# Patient Record
Sex: Female | Born: 1985 | Race: Black or African American | Hispanic: No | Marital: Single
Health system: Southern US, Community
[De-identification: ages and names within clinical notes are randomized; demographics above are authoritative.]

## PROBLEM LIST (undated history)

## (undated) DIAGNOSIS — K219 Gastro-esophageal reflux disease without esophagitis: Secondary | ICD-10-CM

## (undated) DIAGNOSIS — J45909 Unspecified asthma, uncomplicated: Secondary | ICD-10-CM

## (undated) DIAGNOSIS — J302 Other seasonal allergic rhinitis: Secondary | ICD-10-CM

## (undated) DIAGNOSIS — M549 Dorsalgia, unspecified: Secondary | ICD-10-CM

## (undated) HISTORY — DX: Gastro-esophageal reflux disease without esophagitis: K21.9

---

## 2016-04-20 ENCOUNTER — Encounter (HOSPITAL_COMMUNITY): Payer: Self-pay | Admitting: Emergency Medicine

## 2016-04-20 ENCOUNTER — Emergency Department (HOSPITAL_COMMUNITY)
Admission: EM | Admit: 2016-04-20 | Discharge: 2016-04-20 | Disposition: A | Discharge status: Home / Self Care | Payer: Medicare Other | Attending: Emergency Medicine | Admitting: Emergency Medicine

## 2016-04-20 DIAGNOSIS — M545 Low back pain, unspecified: Secondary | ICD-10-CM

## 2016-04-20 DIAGNOSIS — F17210 Nicotine dependence, cigarettes, uncomplicated: Secondary | ICD-10-CM | POA: Insufficient documentation

## 2016-04-20 DIAGNOSIS — Z79899 Other long term (current) drug therapy: Secondary | ICD-10-CM | POA: Diagnosis not present

## 2016-04-20 HISTORY — DX: Dorsalgia, unspecified: M54.9

## 2016-04-20 LAB — URINALYSIS, ROUTINE W REFLEX MICROSCOPIC
Bilirubin Urine: NEGATIVE
Glucose, UA: NEGATIVE mg/dL
Ketones, ur: NEGATIVE mg/dL
Leukocytes, UA: NEGATIVE
Nitrite: NEGATIVE
PROTEIN: NEGATIVE mg/dL
SPECIFIC GRAVITY, URINE: 1.011 (ref 1.005–1.030)
pH: 7 (ref 5.0–8.0)

## 2016-04-20 LAB — POC URINE PREG, ED: Preg Test, Ur: NEGATIVE

## 2016-04-20 MED ORDER — NAPROXEN 500 MG PO TABS: 500.0000 mg | ORAL_TABLET | Freq: Two times a day (BID) | ORAL | 0 refills | Status: DC

## 2016-04-20 MED ORDER — KETOROLAC TROMETHAMINE 60 MG/2ML IM SOLN
60.0000 mg | Freq: Once | INTRAMUSCULAR | Status: AC
  Administered 2016-04-20: 60 mg via INTRAMUSCULAR
  Filled 2016-04-20: qty 2

## 2016-04-20 NOTE — ED Triage Notes (Signed)
Pt complaint of acute chronic lower back pain worsening over past week. Pt verbalizes pain started 9 years ago post epidural with birth of son. Pt denies GU symptoms or recent injury.

## 2016-04-20 NOTE — Discharge Instructions (Signed)
Please take naproxen as needed for pain. Use cold/warm compress to area. Massage, stretch low back. Please follow-up with orthopedic surgery and your primary care provider on Monday regarding today's visit  Get help right away if: You develop new bowel or bladder control problems. You have unusual weakness or numbness in your arms or legs. You develop nausea or vomiting. You develop abdominal pain. You feel faint.

## 2016-04-20 NOTE — ED Provider Notes (Signed)
[facility]-EMERGENCY DEPT Provider Note   CSN: [id number] Arrival date & time: 04/20/16  1741   By signing my name below, I, Soijett Blue, attest that this documentation has been prepared under the direction and in the presence of Lorretta Harp, PA-C Electronically Signed: Soijett Blue, ED Scribe. 04/20/16. 6:41 PM.  History   Chief Complaint Chief Complaint  Patient presents with  . Back Pain    HPI Amanda Gray is a 31 y.o. female with a PMHx of back pain, who presents to the Emergency Department complaining of acute on chronic, constant, sharp/pressure, worsening, bilateral lower back pain onset 1 week ago. She notes that she first experienced her lower back pain s/p epidural 9 years ago. She states that her lower back pain is worsened with position change from seated position. Pt reports associated abdominal pain that is baseline for her. Pt has tried her mother's Rx muscle relaxer and tylenol with no relief of her symptoms. Pt reports that she recently moved to the area 1.5 years ago from [place], [place] and denies having a PCP or being evaluated in the past for her symptoms. Denies fever, chills, nausea, vomiting, bowel/bladder incontinence, dysuria, frequency, urgency, weight loss, night sweats, and any other symptoms. Denies CA or IV drug use, but endorses using cocaine several years ago.   The history is provided by the patient. No language interpreter was used.    Past Medical History:  Diagnosis Date  . Back pain     There are no active problems to display for this patient.   Past Surgical History:  Procedure Laterality Date  . CESAREAN SECTION      OB History    No data available       Home Medications    Prior to Admission medications   Medication Sig Start Date End Date Taking? Authorizing Provider  naproxen (NAPROSYN) 500 MG tablet Take 1 tablet (500 mg total) by mouth 2 (two) times daily. 04/20/16   Francisco Orson Aloe, [place]    Family History No  family history on file.  Social History Social History  Substance Use Topics  . Smoking status: Current Every Day Smoker    Packs/day: 1.00    Types: Cigarettes  . Smokeless tobacco: Never Used  . Alcohol use No     Allergies   Patient has no known allergies.   Review of Systems Review of Systems  Constitutional: Negative for chills, fever and unexpected weight change.  Gastrointestinal: Positive for abdominal pain. Negative for nausea and vomiting.       No bowel incontinence  Genitourinary: Negative for difficulty urinating, dysuria, frequency and urgency.       No bladder incontinence  Musculoskeletal: Positive for back pain (lower). Negative for gait problem and joint swelling.  Neurological: Negative for numbness.       No tingling     Physical Exam Updated Vital Signs BP (!) 149/99 (BP Location: Right Arm)   Pulse 78   Temp 97.5 F (36.4 C) (Oral)   Resp 18   LMP 02/29/2016   SpO2 100%   Physical Exam  Constitutional: She is oriented to person, place, and time. She appears well-developed and well-nourished.  Well appearing  HENT:  Head: Normocephalic and atraumatic.  Nose: Nose normal.  Mouth/Throat: Oropharynx is clear and moist.  Eyes: EOM are normal. Pupils are equal, round, and reactive to light.  Neck: Normal range of motion.  Normal ROM, no neck tenderness. No nuchal rigidity  Cardiovascular: Normal rate, normal  heart sounds and intact distal pulses.   Pulmonary/Chest: Effort normal and breath sounds normal. No respiratory distress.  Normal work of breathing  Abdominal: Soft. There is tenderness. There is no rebound and no guarding.  Soft and minimal tenderness to lower abdomen. No rebound or guarding. No pulsatile mass noted.   Musculoskeletal: She exhibits tenderness. She exhibits no deformity.       Cervical back: Normal.       Thoracic back: Normal.       Lumbar back: She exhibits tenderness and bony tenderness.  There is tenderness to  midline lower back and left and right side of lower back. No midline cervical, thoracic spine tenderness. Good ROM of spine. No deformity. No obvious wound, redness, or swelling noted.   Neurological: She is alert and oriented to person, place, and time.  Cranial Nerves:  III,IV, VI: ptosis not present, extra-ocular movements intact bilaterally, direct and consensual pupillary light reflexes intact bilaterally V: facial sensation, jaw opening, and bite strength equal bilaterally VII: eyebrow raise, eyelid close, smile, frown, pucker equal bilaterally VIII: hearing grossly normal bilaterally  IX,X: palate elevation and swallowing intact XI: bilateral shoulder shrug and lateral head rotation equal and strong XII: midline tongue extension  Negative pronator drift, negative Romberg, negative RAM's, negative heel-to-shin, negative finger to nose.    Sensory intact.  Muscle strength 5/5 Patient able to ambulate without difficulty.   SLR Right -  negative SLR Left -  negative  Skin: Skin is warm.  Psychiatric: She has a normal mood and affect. Her behavior is normal.  Nursing note and vitals reviewed.    ED Treatments / Results  DIAGNOSTIC STUDIES: Oxygen Saturation is 100% on RA, nl by my interpretation.    COORDINATION OF CARE: 6:28 PM Discussed treatment plan with pt at bedside which includes UA and pt agreed to plan.   Labs (all labs ordered are listed, but only abnormal results are displayed) Labs Reviewed  URINALYSIS, ROUTINE W REFLEX MICROSCOPIC - Abnormal; Notable for the following:       Result Value   Hgb urine dipstick MODERATE (*)    Bacteria, UA RARE (*)    Squamous Epithelial / LPF 0-5 (*)    All other components within normal limits  POC URINE PREG, ED    EKG  EKG Interpretation None       Radiology No results found.  Procedures Procedures (including critical care time)  Medications Ordered in ED Medications  ketorolac (TORADOL) injection 60 mg (not  administered)     Initial Impression / Assessment and Plan / ED Course  I have reviewed the triage vital signs and the nursing notes.  Pertinent labs & imaging results that were available during my care of the patient were reviewed by me and considered in my medical decision making (see chart for details).     Patient with back pain.  No neurological deficits and normal neuro exam.  Patient is ambulatory.  No loss of bowel or bladder control.  No concern for cauda equina.  No fever, night sweats, weight loss, h/o cancer, IVDA, no recent procedure to back. No urinary symptoms suggestive of UTI. UA negative evidence for infection. Pt given shot of toradol here in ED. Supportive care and return precaution discussed. Appears safe for discharge at this time. Follow up as indicated in discharge paperwork.    Final Clinical Impressions(s) / ED Diagnoses   Final diagnoses:  Acute midline low back pain without sciatica    New  Prescriptions New Prescriptions   NAPROXEN (NAPROSYN) 500 MG TABLET    Take 1 tablet (500 mg total) by mouth 2 (two) times daily.   I personally performed the services described in this documentation, which was scribed in my presence. The recorded information has been reviewed and is accurate.    [address] [place], [place] 04/20/16 2030    Nira Conn, MD 04/21/16 [phone]

## 2016-10-18 ENCOUNTER — Other Ambulatory Visit: Payer: Self-pay | Admitting: Internal Medicine

## 2016-10-18 DIAGNOSIS — R1011 Right upper quadrant pain: Secondary | ICD-10-CM

## 2016-10-22 ENCOUNTER — Other Ambulatory Visit: Payer: Medicare Other

## 2016-10-28 ENCOUNTER — Other Ambulatory Visit: Payer: Medicare Other

## 2017-03-10 ENCOUNTER — Other Ambulatory Visit: Payer: Self-pay | Admitting: Internal Medicine

## 2017-03-10 DIAGNOSIS — R1013 Epigastric pain: Secondary | ICD-10-CM

## 2017-11-22 ENCOUNTER — Other Ambulatory Visit: Payer: Self-pay | Admitting: Internal Medicine

## 2017-11-22 DIAGNOSIS — R1013 Epigastric pain: Secondary | ICD-10-CM

## 2017-11-27 ENCOUNTER — Encounter: Payer: Self-pay | Admitting: *Deleted

## 2017-12-11 ENCOUNTER — Encounter: Payer: Self-pay | Admitting: *Deleted

## 2017-12-16 ENCOUNTER — Encounter: Payer: Medicaid Other | Admitting: Advanced Practice Midwife

## 2018-03-05 ENCOUNTER — Emergency Department (HOSPITAL_COMMUNITY)
Admission: EM | Admit: 2018-03-05 | Discharge: 2018-03-06 | Disposition: A | Discharge status: Home / Self Care | Payer: Medicaid Other | Attending: Emergency Medicine | Admitting: Emergency Medicine

## 2018-03-05 ENCOUNTER — Encounter (HOSPITAL_COMMUNITY): Payer: Self-pay | Admitting: Emergency Medicine

## 2018-03-05 ENCOUNTER — Other Ambulatory Visit: Payer: Self-pay

## 2018-03-05 DIAGNOSIS — R109 Unspecified abdominal pain: Secondary | ICD-10-CM | POA: Diagnosis not present

## 2018-03-05 DIAGNOSIS — R55 Syncope and collapse: Secondary | ICD-10-CM | POA: Insufficient documentation

## 2018-03-05 DIAGNOSIS — F17210 Nicotine dependence, cigarettes, uncomplicated: Secondary | ICD-10-CM | POA: Diagnosis not present

## 2018-03-05 DIAGNOSIS — Z79899 Other long term (current) drug therapy: Secondary | ICD-10-CM | POA: Insufficient documentation

## 2018-03-05 DIAGNOSIS — R079 Chest pain, unspecified: Secondary | ICD-10-CM | POA: Diagnosis not present

## 2018-03-05 LAB — SAMPLE TO BLOOD BANK

## 2018-03-05 MED ORDER — FENTANYL CITRATE (PF) 100 MCG/2ML IJ SOLN
50.0000 ug | Freq: Once | INTRAMUSCULAR | Status: AC
  Administered 2018-03-05: 50 ug via INTRAVENOUS
  Filled 2018-03-05: qty 2
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
## 2018-03-06 ENCOUNTER — Emergency Department (HOSPITAL_COMMUNITY): Payer: Medicaid Other

## 2018-03-06 LAB — CBC
HCT: 45.8 % (ref 36.0–46.0)
Hemoglobin: 14.7 g/dL (ref 12.0–15.0)
MCH: 30.2 pg (ref 26.0–34.0)
MCHC: 32.1 g/dL (ref 30.0–36.0)
MCV: 94 fL (ref 80.0–100.0)
NRBC: 0 % (ref 0.0–0.2)
PLATELETS: 220 10*3/uL (ref 150–400)
RBC: 4.87 MIL/uL (ref 3.87–5.11)
RDW: 13.4 % (ref 11.5–15.5)
WBC: 11.9 10*3/uL — AB (ref 4.0–10.5)

## 2018-03-06 LAB — COMPREHENSIVE METABOLIC PANEL
ALT: 21 U/L (ref 0–44)
ANION GAP: 6 (ref 5–15)
AST: 24 U/L (ref 15–41)
Albumin: 3.3 g/dL — ABNORMAL LOW (ref 3.5–5.0)
Alkaline Phosphatase: 76 U/L (ref 38–126)
BUN: 12 mg/dL (ref 6–20)
CHLORIDE: 110 mmol/L (ref 98–111)
CO2: 22 mmol/L (ref 22–32)
Calcium: 8.6 mg/dL — ABNORMAL LOW (ref 8.9–10.3)
Creatinine, Ser: 1.39 mg/dL — ABNORMAL HIGH (ref 0.44–1.00)
GFR calc Af Amer: 58 mL/min — ABNORMAL LOW (ref >60)
GFR, EST NON AFRICAN AMERICAN: 50 mL/min — AB (ref >60)
Glucose, Bld: 102 mg/dL — ABNORMAL HIGH (ref 70–99)
Potassium: 3.5 mmol/L (ref 3.5–5.1)
Sodium: 138 mmol/L (ref 135–145)
Total Bilirubin: 0.6 mg/dL (ref 0.3–1.2)
Total Protein: 6.6 g/dL (ref 6.5–8.1)

## 2018-03-06 LAB — ETHANOL

## 2018-03-06 LAB — LACTIC ACID, PLASMA: Lactic Acid, Venous: 1.2 mmol/L (ref 0.5–1.9)

## 2018-03-06 LAB — I-STAT BETA HCG BLOOD, ED (MC, WL, AP ONLY)

## 2018-03-06 LAB — PROTIME-INR
INR: 1.02
Prothrombin Time: 13.3 seconds (ref 11.4–15.2)

## 2018-03-06 MED ORDER — IOHEXOL 300 MG/ML  SOLN
100.0000 mL | Freq: Once | INTRAMUSCULAR | Status: AC | PRN
  Administered 2018-03-06: 100 mL via INTRAVENOUS

## 2018-03-06 MED ORDER — CYCLOBENZAPRINE HCL 10 MG PO TABS: 10.0000 mg | ORAL_TABLET | Freq: Two times a day (BID) | ORAL | 0 refills | Status: DC | PRN

## 2018-03-06 MED ORDER — HYDROCODONE-ACETAMINOPHEN 5-325 MG PO TABS
2.0000 | ORAL_TABLET | Freq: Once | ORAL | Status: AC
  Administered 2018-03-06: 2 via ORAL
  Filled 2018-03-06: qty 2

## 2018-03-06 MED ORDER — IBUPROFEN 800 MG PO TABS: 800.0000 mg | ORAL_TABLET | Freq: Three times a day (TID) | ORAL | 0 refills | Status: DC

## 2018-03-06 NOTE — ED Notes (Signed)
Patient Alert and oriented to baseline. Stable and ambulatory to baseline. Patient verbalized understanding of the discharge instructions.  Patient belongings were taken by the patient.   

## 2018-03-06 NOTE — ED Notes (Signed)
Patient transported to CT 

## 2018-10-15 ENCOUNTER — Other Ambulatory Visit: Payer: Self-pay | Admitting: Physician Assistant

## 2018-10-15 ENCOUNTER — Other Ambulatory Visit: Payer: Self-pay

## 2018-10-15 ENCOUNTER — Encounter (HOSPITAL_COMMUNITY): Payer: Self-pay | Admitting: Emergency Medicine

## 2018-10-15 ENCOUNTER — Emergency Department (HOSPITAL_COMMUNITY)
Admission: EM | Admit: 2018-10-15 | Discharge: 2018-10-15 | Disposition: A | Discharge status: Home / Self Care | Payer: Medicaid Other | Attending: Emergency Medicine | Admitting: Emergency Medicine

## 2018-10-15 DIAGNOSIS — F17210 Nicotine dependence, cigarettes, uncomplicated: Secondary | ICD-10-CM | POA: Insufficient documentation

## 2018-10-15 DIAGNOSIS — R300 Dysuria: Secondary | ICD-10-CM | POA: Diagnosis present

## 2018-10-15 DIAGNOSIS — N390 Urinary tract infection, site not specified: Secondary | ICD-10-CM | POA: Diagnosis not present

## 2018-10-15 DIAGNOSIS — R10815 Periumbilic abdominal tenderness: Secondary | ICD-10-CM | POA: Insufficient documentation

## 2018-10-15 LAB — LIPASE, BLOOD: Lipase: 27 U/L (ref 11–51)

## 2018-10-15 LAB — URINALYSIS, ROUTINE W REFLEX MICROSCOPIC
Bilirubin Urine: NEGATIVE
Glucose, UA: NEGATIVE mg/dL
Ketones, ur: 5 mg/dL — AB
Nitrite: NEGATIVE
Protein, ur: NEGATIVE mg/dL
Specific Gravity, Urine: 1.018 (ref 1.005–1.030)
pH: 5 (ref 5.0–8.0)

## 2018-10-15 LAB — CBC
HCT: 47 % — ABNORMAL HIGH (ref 36.0–46.0)
Hemoglobin: 16.1 g/dL — ABNORMAL HIGH (ref 12.0–15.0)
MCH: 32.5 pg (ref 26.0–34.0)
MCHC: 34.3 g/dL (ref 30.0–36.0)
MCV: 94.9 fL (ref 80.0–100.0)
Platelets: 271 10*3/uL (ref 150–400)
RBC: 4.95 MIL/uL (ref 3.87–5.11)
RDW: 12.8 % (ref 11.5–15.5)
WBC: 7.5 10*3/uL (ref 4.0–10.5)
nRBC: 0 % (ref 0.0–0.2)

## 2018-10-15 LAB — COMPREHENSIVE METABOLIC PANEL
ALT: 20 U/L (ref 0–44)
AST: 19 U/L (ref 15–41)
Albumin: 3.8 g/dL (ref 3.5–5.0)
Alkaline Phosphatase: 87 U/L (ref 38–126)
Anion gap: 9 (ref 5–15)
BUN: 7 mg/dL (ref 6–20)
CO2: 25 mmol/L (ref 22–32)
Calcium: 8.9 mg/dL (ref 8.9–10.3)
Chloride: 106 mmol/L (ref 98–111)
Creatinine, Ser: 1.11 mg/dL — ABNORMAL HIGH (ref 0.44–1.00)
GFR calc Af Amer: >60 mL/min (ref >60)
GFR calc non Af Amer: >60 mL/min (ref >60)
Glucose, Bld: 92 mg/dL (ref 70–99)
Potassium: 3.9 mmol/L (ref 3.5–5.1)
Sodium: 140 mmol/L (ref 135–145)
Total Bilirubin: 0.8 mg/dL (ref 0.3–1.2)
Total Protein: 7.8 g/dL (ref 6.5–8.1)

## 2018-10-15 LAB — I-STAT BETA HCG BLOOD, ED (MC, WL, AP ONLY): I-stat hCG, quantitative: <5 m[IU]/mL (ref <5)

## 2018-10-15 LAB — WET PREP, GENITAL
Clue Cells Wet Prep HPF POC: NONE SEEN
Sperm: NONE SEEN
Trich, Wet Prep: NONE SEEN
Yeast Wet Prep HPF POC: NONE SEEN

## 2018-10-15 MED ORDER — SODIUM CHLORIDE 0.9% FLUSH: 3.0000 mL | Freq: Once | INTRAVENOUS | Status: DC

## 2018-10-15 MED ORDER — CEPHALEXIN 500 MG PO CAPS: 500.0000 mg | ORAL_CAPSULE | Freq: Four times a day (QID) | ORAL | 0 refills | Status: DC

## 2018-10-15 MED ORDER — PHENAZOPYRIDINE HCL 200 MG PO TABS: 200.0000 mg | ORAL_TABLET | Freq: Three times a day (TID) | ORAL | 0 refills | Status: DC

## 2018-10-15 MED ORDER — FAMOTIDINE 20 MG PO TABS: 20.0000 mg | ORAL_TABLET | Freq: Two times a day (BID) | ORAL | 0 refills | Status: DC

## 2018-10-15 NOTE — Discharge Instructions (Addendum)
Please read instructions below. Take your antibiotic as directed until it is gone. Drink plenty of water. Schedule an appointment with your primary care provider to follow up in 1 week. Return to the ER if you develop a fever, have nausea, worsening back pain, or new or concerning symptoms.

## 2018-10-15 NOTE — ED Triage Notes (Signed)
Pt complains of lower abd pain for several days along with dysuria.

## 2018-10-15 NOTE — ED Notes (Signed)
Pelvic cart at door. Pt changed into gown.

## 2018-10-15 NOTE — ED Provider Notes (Signed)
[facility] Provider Note   CSN: [id number] Arrival date & time: 10/15/18  1152     History   Chief Complaint Chief Complaint  Patient presents with  . Abdominal Pain  . Dysuria    HPI Amanda Gray is a 33 y.o. female with no relevant past medical history presents to the ER with a 4-day history of dysuria, suprapubic discomfort, and flank pain bilaterally.  She states that the pain is a 2 out of 10, but that her flank pain makes it difficult for her to get comfortable enough to sleep. Flank pain does not only occur with urination. She has not taken any for her discomfort.  Denies any aggravating or alleviating factors.  She also would like to get tested for STIs.  Denies any fevers, chills, abnormal discharge, change in bowel habits, nausea, diminished appetite, vomiting, shortness of breath, chest pain, headache, or dizziness.     HPI  Past Medical History:  Diagnosis Date  . Back pain     There are no active problems to display for this patient.   Past Surgical History:  Procedure Laterality Date  . CESAREAN SECTION       OB History   No obstetric history on file.      Home Medications    Prior to Admission medications   Medication Sig Start Date End Date Taking? Authorizing Provider  albuterol (PROAIR HFA) 108 (90 Base) MCG/ACT inhaler Inhale 2 puffs into the lungs every 4 (four) hours as needed for wheezing.    Yes [provider]  famotidine (PEPCID) 20 MG tablet Take 20 mg by mouth 2 (two) times daily.   Yes [provider]  fluticasone (FLOVENT HFA) 110 MCG/ACT inhaler Inhale 1 puff into the lungs 2 (two) times daily.   Yes [provider]  cephALEXin (KEFLEX) 500 MG capsule Take 1 capsule (500 mg total) by mouth 4 (four) times daily. 10/15/18   Lorelee NewGreen, Melodye Swor L, PA-C  phenazopyridine (PYRIDIUM) 200 MG tablet Take 1 tablet (200 mg total) by mouth 3 (three) times daily. 10/15/18   Lorelee NewGreen, Javien Tesch L,  PA-C    Family History History reviewed. No pertinent family history.  Social History Social History   Tobacco Use  . Smoking status: Current Every Day Smoker    Packs/day: 1.00    Types: Cigarettes  . Smokeless tobacco: Never Used  Substance Use Topics  . Alcohol use: No  . Drug use: No     Allergies   Patient has no known allergies.   Review of Systems Review of Systems  All other systems reviewed and are negative.    Physical Exam Updated Vital Signs BP (!) 140/96   Pulse 100   Temp 98.2 F (36.8 C) (Oral)   Resp 16   Ht 5\' 2"  (1.575 m)   Wt 106.1 kg   LMP 08/29/2018   SpO2 100%   BMI 42.80 kg/m   Physical Exam Constitutional:      Appearance: Normal appearance.  HENT:     Head: Normocephalic and atraumatic.  Eyes:     General: No scleral icterus.    Conjunctiva/sclera: Conjunctivae normal.  Cardiovascular:     Rate and Rhythm: Normal rate and regular rhythm.     Pulses: Normal pulses.     Heart sounds: Normal heart sounds.  Pulmonary:     Effort: Pulmonary effort is normal. No respiratory distress.     Breath sounds: Normal breath sounds.  Abdominal:  Comments: Abdomen is soft, nondistended, tenderness to palpation in lower quadrants, particularly suprapubic region.  No guarding.  No overlying erythema.  Positive CVAT bilaterally.  Genitourinary:    Comments: Speculum exam revealed nonerythematous cervix with no noted discharge. Bimanual exam revealed negative chandelier sign.  No left or right adnexal tenderness.   Neurological:     Mental Status: She is alert.     GCS: GCS eye subscore is 4. GCS verbal subscore is 5. GCS motor subscore is 6.  Psychiatric:        Mood and Affect: Mood normal.        Behavior: Behavior normal.        Thought Content: Thought content normal.      ED Treatments / Results  Labs (all labs ordered are listed, but only abnormal results are displayed) Labs Reviewed  WET PREP, GENITAL - Abnormal; Notable  for the following components:      Result Value   WBC, Wet Prep HPF POC FEW (*)    All other components within normal limits  COMPREHENSIVE METABOLIC PANEL - Abnormal; Notable for the following components:   Creatinine, Ser 1.11 (*)    All other components within normal limits  CBC - Abnormal; Notable for the following components:   Hemoglobin 16.1 (*)    HCT 47.0 (*)    All other components within normal limits  URINALYSIS, ROUTINE W REFLEX MICROSCOPIC - Abnormal; Notable for the following components:   Hgb urine dipstick MODERATE (*)    Ketones, ur 5 (*)    Leukocytes,Ua LARGE (*)    Bacteria, UA RARE (*)    All other components within normal limits  LIPASE, BLOOD  HIV ANTIBODY (ROUTINE TESTING W REFLEX)  RPR  I-STAT BETA HCG BLOOD, ED ([facility], [facility], [facility] ONLY)  GC/CHLAMYDIA PROBE AMP ([place]) NOT AT [facility]  WET PREP  (BD AFFIRM) ([place])    EKG None  Radiology No results found.  Procedures Procedures (including critical care time)  Medications Ordered in ED Medications - No data to display   Initial Impression / Assessment and Plan / ED Course  I have reviewed the triage vital signs and the nursing notes.  Pertinent labs & imaging results that were available during my care of the patient were reviewed by me and considered in my medical decision making (see chart for details).        Patient's history, physical exam, and labs are consistent with a simple cystitis.  While patient reports bilateral flank pain and mild CVAT on exam, patient remains afebrile and white count is very reassuring.  Given dysuria, patient requested screening for STIs.  She denies any abnormal discharge and denies numerous new sexual contacts.  Wet prep obtained was reassuring did not show any evidence of yeast infection, trichomoniasis, or bacterial vaginosis which was consistent with my physical exam.  Patient's UA was interpreted and demonstrates bacteria and large leukocyte count,  concerning for UTI.  Patient also has moderate hemoglobin, but denies flank pain radiating to groin with urination or any other symptoms that would be more suspicious for stones. No nausea or vomiting. Do not believe that stone study is indicated at this point.  Will treat with Keflex outpatient, encourage increased fluid hydration, and will prescribe phenazopyridine for her dysuria.  Discussed return precautions.  Patient voiced understanding and is agreeable to plan.  Final Clinical Impressions(s) / ED Diagnoses   Final diagnoses:  Lower urinary tract infectious disease    ED Discharge  Orders         Ordered    cephALEXin (KEFLEX) 500 MG capsule  4 times daily     10/15/18 1428    phenazopyridine (PYRIDIUM) 200 MG tablet  3 times daily     10/15/18 1428           Elvera Maria 10/15/18 1441    Alvira Monday, MD 10/15/18 2147

## 2018-10-16 LAB — RPR: RPR Ser Ql: NONREACTIVE

## 2018-10-16 LAB — HIV ANTIBODY (ROUTINE TESTING W REFLEX): HIV Screen 4th Generation wRfx: NONREACTIVE

## 2018-10-16 LAB — CERVICOVAGINAL ANCILLARY ONLY
Chlamydia: NEGATIVE
Neisseria Gonorrhea: NEGATIVE

## 2019-01-15 ENCOUNTER — Ambulatory Visit: Payer: Medicaid Other | Admitting: Family Medicine

## 2019-03-28 ENCOUNTER — Other Ambulatory Visit: Payer: Self-pay

## 2019-03-28 ENCOUNTER — Encounter (HOSPITAL_COMMUNITY): Payer: Self-pay | Admitting: Emergency Medicine

## 2019-03-28 ENCOUNTER — Emergency Department (HOSPITAL_COMMUNITY)
Admission: EM | Admit: 2019-03-28 | Discharge: 2019-03-28 | Disposition: A | Discharge status: Home / Self Care | Payer: Medicaid Other | Attending: Emergency Medicine | Admitting: Emergency Medicine

## 2019-03-28 ENCOUNTER — Emergency Department (HOSPITAL_COMMUNITY): Payer: Medicaid Other

## 2019-03-28 DIAGNOSIS — F17210 Nicotine dependence, cigarettes, uncomplicated: Secondary | ICD-10-CM | POA: Insufficient documentation

## 2019-03-28 DIAGNOSIS — R1030 Lower abdominal pain, unspecified: Secondary | ICD-10-CM | POA: Diagnosis not present

## 2019-03-28 DIAGNOSIS — M545 Low back pain, unspecified: Secondary | ICD-10-CM

## 2019-03-28 DIAGNOSIS — Z79899 Other long term (current) drug therapy: Secondary | ICD-10-CM | POA: Insufficient documentation

## 2019-03-28 DIAGNOSIS — Z202 Contact with and (suspected) exposure to infections with a predominantly sexual mode of transmission: Secondary | ICD-10-CM | POA: Insufficient documentation

## 2019-03-28 DIAGNOSIS — N390 Urinary tract infection, site not specified: Secondary | ICD-10-CM | POA: Insufficient documentation

## 2019-03-28 LAB — CBC WITH DIFFERENTIAL/PLATELET
Abs Immature Granulocytes: 0.01 10*3/uL (ref 0.00–0.07)
Basophils Absolute: 0 10*3/uL (ref 0.0–0.1)
Basophils Relative: 0 %
Eosinophils Absolute: 0.2 10*3/uL (ref 0.0–0.5)
Eosinophils Relative: 4 %
HCT: 44.8 % (ref 36.0–46.0)
Hemoglobin: 14.7 g/dL (ref 12.0–15.0)
Immature Granulocytes: 0 %
Lymphocytes Relative: 44 %
Lymphs Abs: 2.4 10*3/uL (ref 0.7–4.0)
MCH: 30.3 pg (ref 26.0–34.0)
MCHC: 32.8 g/dL (ref 30.0–36.0)
MCV: 92.4 fL (ref 80.0–100.0)
Monocytes Absolute: 0.5 10*3/uL (ref 0.1–1.0)
Monocytes Relative: 9 %
Neutro Abs: 2.4 10*3/uL (ref 1.7–7.7)
Neutrophils Relative %: 43 %
Platelets: 232 10*3/uL (ref 150–400)
RBC: 4.85 MIL/uL (ref 3.87–5.11)
RDW: 14.3 % (ref 11.5–15.5)
WBC: 5.6 10*3/uL (ref 4.0–10.5)
nRBC: 0 % (ref 0.0–0.2)

## 2019-03-28 LAB — COMPREHENSIVE METABOLIC PANEL
ALT: 20 U/L (ref 0–44)
AST: 23 U/L (ref 15–41)
Albumin: 3.3 g/dL — ABNORMAL LOW (ref 3.5–5.0)
Alkaline Phosphatase: 83 U/L (ref 38–126)
Anion gap: 9 (ref 5–15)
BUN: 9 mg/dL (ref 6–20)
CO2: 21 mmol/L — ABNORMAL LOW (ref 22–32)
Calcium: 8.5 mg/dL — ABNORMAL LOW (ref 8.9–10.3)
Chloride: 107 mmol/L (ref 98–111)
Creatinine, Ser: 1.05 mg/dL — ABNORMAL HIGH (ref 0.44–1.00)
GFR calc Af Amer: >60 mL/min (ref >60)
GFR calc non Af Amer: >60 mL/min (ref >60)
Glucose, Bld: 91 mg/dL (ref 70–99)
Potassium: 4 mmol/L (ref 3.5–5.1)
Sodium: 137 mmol/L (ref 135–145)
Total Bilirubin: 1 mg/dL (ref 0.3–1.2)
Total Protein: 6.8 g/dL (ref 6.5–8.1)

## 2019-03-28 LAB — WET PREP, GENITAL
Sperm: NONE SEEN
Trich, Wet Prep: NONE SEEN
Yeast Wet Prep HPF POC: NONE SEEN

## 2019-03-28 LAB — URINALYSIS, ROUTINE W REFLEX MICROSCOPIC
Bilirubin Urine: NEGATIVE
Glucose, UA: NEGATIVE mg/dL
Ketones, ur: NEGATIVE mg/dL
Leukocytes,Ua: NEGATIVE
Nitrite: NEGATIVE
Protein, ur: NEGATIVE mg/dL
Specific Gravity, Urine: 1.02 (ref 1.005–1.030)
pH: 6 (ref 5.0–8.0)

## 2019-03-28 LAB — PREGNANCY, URINE: Preg Test, Ur: NEGATIVE

## 2019-03-28 LAB — HIV ANTIBODY (ROUTINE TESTING W REFLEX): HIV Screen 4th Generation wRfx: NONREACTIVE

## 2019-03-28 MED ORDER — CEPHALEXIN 500 MG PO CAPS: 500.0000 mg | ORAL_CAPSULE | Freq: Four times a day (QID) | ORAL | 0 refills | Status: AC

## 2019-03-28 MED ORDER — CEPHALEXIN 250 MG PO CAPS
500.0000 mg | ORAL_CAPSULE | Freq: Once | ORAL | Status: AC
  Administered 2019-03-28: 500 mg via ORAL
  Filled 2019-03-28: qty 2

## 2019-03-28 NOTE — ED Triage Notes (Signed)
Pt reports urinary frequency, burning with urination and lower back pain x1 week. Denies fevers or chills, denies recent sick contacts. Ambulatory to treatment room with steady gait.

## 2019-03-28 NOTE — ED Provider Notes (Signed)
[place] [facility] EMERGENCY DEPARTMENT Provider Note   CSN: [id number] Arrival date & time: 03/28/19  1002     History Chief Complaint  Patient presents with  . Urinary Frequency  . Back Pain    Amanda Gray is a 34 y.o. female who presents for evaluation of 1 week of lower back pain, lower abdominal pain, dysuria, increased urinary frequency that has been ongoing.  She states that she feels like "both of her kidneys hurt and are sore."  He states it is both sides of her lower back.  She states that this pain is worsened when she moves, when she breathes.  She is not having any trouble breathing he denies any chest pain or chest pain with deep inspiration.  She reports that the abdominal pain is mostly in the suprapubic region.  No associated nausea or vomiting.  She states she has not any vaginal discharge but has had some vaginal itching.  She is currently sexually active with 2 partners.  They do not use protection.  She does not have any fevers, chest pain, difficulty breathing, hematuria. She denies any OCP use, recent immobilization, prior history of DVT/PE, recent surgery, leg swelling, or long travel.  The history is provided by the patient.       Past Medical History:  Diagnosis Date  . Back pain     There are no problems to display for this patient.   Past Surgical History:  Procedure Laterality Date  . CESAREAN SECTION       OB History   No obstetric history on file.     No family history on file.  Social History   Tobacco Use  . Smoking status: Current Every Day Smoker    Packs/day: 1.00    Types: Cigarettes  . Smokeless tobacco: Never Used  Substance Use Topics  . Alcohol use: No  . Drug use: No    Home Medications Prior to Admission medications   Medication Sig Start Date End Date Taking? Authorizing Provider  albuterol (PROAIR HFA) 108 (90 Base) MCG/ACT inhaler Inhale 2 puffs into the lungs every 4 (four) hours as needed for  wheezing.     [provider]  cephALEXin (KEFLEX) 500 MG capsule Take 1 capsule (500 mg total) by mouth 4 (four) times daily for 7 days. 03/28/19 04/04/19  Volanda Napoleon, PA-C  famotidine (PEPCID) 20 MG tablet Take 1 tablet (20 mg total) by mouth 2 (two) times daily. 10/15/18   Domenic Moras, PA-C  fluticasone (FLOVENT HFA) 110 MCG/ACT inhaler Inhale 1 puff into the lungs 2 (two) times daily.    [provider]  phenazopyridine (PYRIDIUM) 200 MG tablet Take 1 tablet (200 mg total) by mouth 3 (three) times daily. 10/15/18   Corena Herter, PA-C    Allergies    Patient has no known allergies.  Review of Systems   Review of Systems  Constitutional: Negative for fever.  Respiratory: Negative for cough and shortness of breath.   Cardiovascular: Negative for chest pain.  Gastrointestinal: Positive for abdominal pain. Negative for nausea and vomiting.  Genitourinary: Positive for dysuria, flank pain and hematuria.       Vaginal itching  Musculoskeletal: Positive for back pain.  Neurological: Negative for headaches.  All other systems reviewed and are negative.   Physical Exam Updated Vital Signs BP 135/75 (BP Location: Right Arm)   Pulse 76   Temp 98.5 F (36.9 C) (Oral)   Resp 16   SpO2 100%  Physical Exam Vitals and nursing note reviewed. Exam conducted with a chaperone present.  Constitutional:      Appearance: Normal appearance. She is well-developed.     Comments: Sitting comfortably on examination table  HENT:     Head: Normocephalic and atraumatic.  Eyes:     General: Lids are normal.     Conjunctiva/sclera: Conjunctivae normal.     Pupils: Pupils are equal, round, and reactive to light.  Cardiovascular:     Rate and Rhythm: Normal rate and regular rhythm.     Pulses: Normal pulses.     Heart sounds: Normal heart sounds. No murmur. No friction rub. No gallop.   Pulmonary:     Effort: Pulmonary effort is normal.     Breath sounds: Normal breath  sounds.     Comments: Lungs clear to auscultation bilaterally.  Symmetric chest rise.  No wheezing, rales, rhonchi. Abdominal:     Palpations: Abdomen is soft. Abdomen is not rigid.     Tenderness: There is abdominal tenderness in the suprapubic area. There is right CVA tenderness and left CVA tenderness. There is no guarding.     Comments: Abdomen is soft, non-distended. Mild tenderness noted to the suprapubic region. CVA tenderness noted bilaterally, right > left.   Genitourinary:    Comments: The exam was performed with a chaperone present. Normal external female genitalia.  She has a small 1 cm area of erythema noted to the right labia consistent with an ingrown hair.  It is nontender.  No overlying warmth.  No active drainage.  No CMT.  No cervical discharge.  No adnexal mass or tenderness noted bilaterally. Musculoskeletal:        General: Normal range of motion.     Cervical back: Full passive range of motion without pain.  Skin:    General: Skin is warm and dry.     Capillary Refill: Capillary refill takes less than 2 seconds.  Neurological:     Mental Status: She is alert and oriented to person, place, and time.  Psychiatric:        Speech: Speech normal.     ED Results / Procedures / Treatments   Labs (all labs ordered are listed, but only abnormal results are displayed) Labs Reviewed  WET PREP, GENITAL - Abnormal; Notable for the following components:      Result Value   Clue Cells Wet Prep HPF POC PRESENT (*)    WBC, Wet Prep HPF POC FEW (*)    All other components within normal limits  URINALYSIS, ROUTINE W REFLEX MICROSCOPIC - Abnormal; Notable for the following components:   APPearance HAZY (*)    Hgb urine dipstick MODERATE (*)    Bacteria, UA RARE (*)    All other components within normal limits  COMPREHENSIVE METABOLIC PANEL - Abnormal; Notable for the following components:   CO2 21 (*)    Creatinine, Ser 1.05 (*)    Calcium 8.5 (*)    Albumin 3.3 (*)    All  other components within normal limits  PREGNANCY, URINE  CBC WITH DIFFERENTIAL/PLATELET  HIV ANTIBODY (ROUTINE TESTING W REFLEX)  RPR  WET PREP  (BD AFFIRM) ([place])  GC/CHLAMYDIA PROBE AMP ([place]) NOT AT [facility]    EKG None  Radiology CT Renal Stone Study  Result Date: 03/28/2019 CLINICAL DATA:  34 year old female with acute flank and back pain for 1 week. EXAM: CT ABDOMEN AND PELVIS WITHOUT CONTRAST TECHNIQUE: Multidetector CT imaging of the abdomen and pelvis  was performed following the standard protocol without IV contrast. COMPARISON:  03/06/2018 CT FINDINGS: Please note that parenchymal abnormalities may be missed without intravenous contrast. Lower chest: No acute abnormality. Hepatobiliary: The liver and gallbladder are unremarkable. No biliary dilatation. Pancreas: Unremarkable Spleen: Unremarkable Adrenals/Urinary Tract: The kidneys, adrenal glands and bladder are unremarkable. Stomach/Bowel: Stomach is within normal limits. Appendix appears normal. No evidence of bowel wall thickening, distention, or inflammatory changes. Vascular/Lymphatic: No significant vascular findings are present. No enlarged abdominal or pelvic lymph nodes. Reproductive: Uterus and bilateral adnexa are unremarkable. Other: No ascites, focal collection or pneumoperitoneum. Musculoskeletal: No acute or suspicious bony abnormalities. A sclerotic lesion within the RIGHT iliac bone is slightly smaller since 2020. IMPRESSION: No acute or significant abnormalities. No findings to suggest a cause for this patient's abdominal pain. Electronically Signed   By: Harmon Pier M.D.   On: 03/28/2019 11:37    Procedures Procedures (including critical care time)  Medications Ordered in ED Medications  cephALEXin (KEFLEX) capsule 500 mg (500 mg Oral Given 03/28/19 1310)    ED Course  I have reviewed the triage vital signs and the nursing notes.  Pertinent labs & imaging results that were available during my care of  the patient were reviewed by me and considered in my medical decision making (see chart for details).    MDM Rules/Calculators/A&P                      34 year old female who presents for evaluation of 1 week of lower back pain, abdominal pain as well as increased urinary frequency, dysuria.  No fevers, nausea/vomiting.  Also states she has had some vaginal itching and she is concerned about STDs.  She is currently sexually active with 2 partners and they do not use protection.  On initially arrival, she is afebrile, nontoxic-appearing.  Vital signs are stable.  On exam, she has some tenderness noted to her suprapubic region as well as CVA tenderness.  Consider GU etiology versus STDs versus musculoskeletal pain.  History/physical exam not concerning for PE.  She has no risk factors.  CMP shows creatinine 1.05.  BUN within normal limits.  Bicarb is 21.  Otherwise unremarkable.  CBC without any significant leukocytosis or anemia.  UA shows moderate hemoglobin.  No leukocytes, nitrites.  She does have some bacteria but there is squamous epithelium.  She is symptomatic though so we will plan to treat.  Pelvic exam as documented above.  No CMT that would be concerning for PID.  No adnexal mass or tenderness.  No further evaluation with ultrasound warranted.  She does have a small area on the right labia consistent with an ingrown hair.  No evidence of Bartholin's abscess that would need I&D. Additionally, does not appear to be a sore consistent with syphillis.   CT renal study negative for any acute abnormalities.  Wet prep shows positive clue cells.  No evidence of trichomonas.  She does not have any discharge.  Not suspect this is concerning for BV.  Discussed results with patient.  Patient has no known drug allergies.  I discussed that she has gonorrhea/committee a, HIV and RPR pending.  We discussed treatment options.  Patient states she would rather wait to the results come back.  I feel that this  is reasonable.  We will plan to send her home with Keflex.  Encouraged at home supportive care measures. At this time, patient exhibits no emergent life-threatening condition that require further evaluation in ED  or admission. Patient had ample opportunity for questions and discussion. All patient's questions were answered with full understanding. Strict return precautions discussed. Patient expresses understanding and agreement to plan.    Portions of this note were generated with Scientist, clinical (histocompatibility and immunogenetics). Dictation errors may occur despite best attempts at proofreading.  Final Clinical Impression(s) / ED Diagnoses Final diagnoses:  Lower urinary tract infectious disease  Acute bilateral low back pain, unspecified whether sciatica present    Rx / DC Orders ED Discharge Orders         Ordered    cephALEXin (KEFLEX) 500 MG capsule  4 times daily     03/28/19 1256           Rosana Hoes 03/28/19 1621    Linwood Dibbles, MD 03/29/19 1018

## 2019-03-28 NOTE — Discharge Instructions (Signed)
You have been treated today for a urinary tract infection.  Take antibiotics as directed. Please take all of your antibiotics until finished.  You can take Tylenol or Ibuprofen as directed for pain. You can alternate Tylenol and Ibuprofen every 4 hours. If you take Tylenol at 1pm, then you can take Ibuprofen at 5pm. Then you can take Tylenol again at 9pm.   You have cultures pending for gonorrhea/committee you.  You also have HIV and syphilis labs pending.  If you are positive, they will notify you.  If you are negative, you may not hear from them.  He can we check your results on MyChart.  If you are positive, you will need to seek treatment.  You should not have any intercourse until your results come back.  As we discussed it looks like you have a small ingrown hair on the right side of your vagina.  You can apply warm compresses.  Follow-up with OB/GYN if this has not improved in 1 to 2 weeks.  Additionally, if it starts becoming painful, red, warm restart developing fevers, you will need to return the emergency department.  Return the emergency department for worsening pain, fevers, vomiting or any other worsening or concerning symptoms.

## 2019-03-29 LAB — RPR: RPR Ser Ql: NONREACTIVE

## 2019-03-30 LAB — GC/CHLAMYDIA PROBE AMP (~~LOC~~) NOT AT ARMC
Chlamydia: NEGATIVE
Neisseria Gonorrhea: NEGATIVE

## 2019-07-20 ENCOUNTER — Emergency Department (HOSPITAL_COMMUNITY)
Admission: EM | Admit: 2019-07-20 | Discharge: 2019-07-21 | Disposition: A | Discharge status: Home / Self Care | Payer: Medicaid Other | Attending: Emergency Medicine | Admitting: Emergency Medicine

## 2019-07-20 ENCOUNTER — Other Ambulatory Visit: Payer: Self-pay

## 2019-07-20 ENCOUNTER — Encounter (HOSPITAL_COMMUNITY): Payer: Self-pay | Admitting: Emergency Medicine

## 2019-07-20 DIAGNOSIS — R300 Dysuria: Secondary | ICD-10-CM | POA: Insufficient documentation

## 2019-07-20 DIAGNOSIS — N898 Other specified noninflammatory disorders of vagina: Secondary | ICD-10-CM | POA: Insufficient documentation

## 2019-07-20 DIAGNOSIS — Z5321 Procedure and treatment not carried out due to patient leaving prior to being seen by health care provider: Secondary | ICD-10-CM | POA: Diagnosis not present

## 2019-07-20 DIAGNOSIS — R1030 Lower abdominal pain, unspecified: Secondary | ICD-10-CM | POA: Diagnosis not present

## 2019-07-20 LAB — BASIC METABOLIC PANEL
Anion gap: 13 (ref 5–15)
BUN: 7 mg/dL (ref 6–20)
CO2: 19 mmol/L — ABNORMAL LOW (ref 22–32)
Calcium: 8.9 mg/dL (ref 8.9–10.3)
Chloride: 105 mmol/L (ref 98–111)
Creatinine, Ser: 1.2 mg/dL — ABNORMAL HIGH (ref 0.44–1.00)
GFR calc Af Amer: >60 mL/min (ref >60)
GFR calc non Af Amer: 59 mL/min — ABNORMAL LOW (ref >60)
Glucose, Bld: 96 mg/dL (ref 70–99)
Potassium: 3.7 mmol/L (ref 3.5–5.1)
Sodium: 137 mmol/L (ref 135–145)

## 2019-07-20 LAB — URINALYSIS, ROUTINE W REFLEX MICROSCOPIC
Bilirubin Urine: NEGATIVE
Glucose, UA: NEGATIVE mg/dL
Ketones, ur: NEGATIVE mg/dL
Nitrite: NEGATIVE
Protein, ur: NEGATIVE mg/dL
Specific Gravity, Urine: 1.021 (ref 1.005–1.030)
pH: 5 (ref 5.0–8.0)

## 2019-07-20 LAB — CBC
HCT: 48.4 % — ABNORMAL HIGH (ref 36.0–46.0)
Hemoglobin: 15.6 g/dL — ABNORMAL HIGH (ref 12.0–15.0)
MCH: 30.1 pg (ref 26.0–34.0)
MCHC: 32.2 g/dL (ref 30.0–36.0)
MCV: 93.4 fL (ref 80.0–100.0)
Platelets: 275 10*3/uL (ref 150–400)
RBC: 5.18 MIL/uL — ABNORMAL HIGH (ref 3.87–5.11)
RDW: 14 % (ref 11.5–15.5)
WBC: 13.1 10*3/uL — ABNORMAL HIGH (ref 4.0–10.5)
nRBC: 0 % (ref 0.0–0.2)

## 2019-07-20 LAB — I-STAT BETA HCG BLOOD, ED (MC, WL, AP ONLY): I-stat hCG, quantitative: <5 m[IU]/mL (ref <5)

## 2019-07-20 MED ORDER — ACETAMINOPHEN 325 MG PO TABS
650.0000 mg | ORAL_TABLET | Freq: Once | ORAL | Status: AC | PRN
  Administered 2019-07-20: 650 mg via ORAL
  Filled 2019-07-20: qty 2

## 2019-07-20 NOTE — ED Triage Notes (Signed)
Patient arrives to ED with complaints of suprapubic abdominal pain that radiates to both flanks that started today. Patient states she is also having abnormal vaginal discharge, foul vaginal odors, a swollen vagina, and pain on urination.

## 2019-07-20 NOTE — ED Notes (Signed)
Pt called for vitals with no response. 

## 2019-07-21 NOTE — ED Notes (Signed)
Pt called for role call and vitals x3 with no response.

## 2019-07-22 LAB — URINE CULTURE

## 2020-07-27 ENCOUNTER — Other Ambulatory Visit: Payer: Self-pay

## 2020-07-27 ENCOUNTER — Emergency Department (HOSPITAL_COMMUNITY): Payer: Medicaid Other

## 2020-07-27 ENCOUNTER — Emergency Department (HOSPITAL_COMMUNITY)
Admission: EM | Admit: 2020-07-27 | Discharge: 2020-07-27 | Disposition: A | Discharge status: Home / Self Care | Payer: Medicaid Other | Attending: Emergency Medicine | Admitting: Emergency Medicine

## 2020-07-27 DIAGNOSIS — R21 Rash and other nonspecific skin eruption: Secondary | ICD-10-CM | POA: Diagnosis present

## 2020-07-27 DIAGNOSIS — F17210 Nicotine dependence, cigarettes, uncomplicated: Secondary | ICD-10-CM | POA: Diagnosis not present

## 2020-07-27 DIAGNOSIS — L03811 Cellulitis of head [any part, except face]: Secondary | ICD-10-CM | POA: Diagnosis not present

## 2020-07-27 DIAGNOSIS — L230 Allergic contact dermatitis due to metals: Secondary | ICD-10-CM

## 2020-07-27 DIAGNOSIS — R599 Enlarged lymph nodes, unspecified: Secondary | ICD-10-CM

## 2020-07-27 LAB — COMPREHENSIVE METABOLIC PANEL
ALT: 16 U/L (ref 0–44)
AST: 17 U/L (ref 15–41)
Albumin: 3.3 g/dL — ABNORMAL LOW (ref 3.5–5.0)
Alkaline Phosphatase: 80 U/L (ref 38–126)
Anion gap: 10 (ref 5–15)
BUN: 9 mg/dL (ref 6–20)
CO2: 21 mmol/L — ABNORMAL LOW (ref 22–32)
Calcium: 8.7 mg/dL — ABNORMAL LOW (ref 8.9–10.3)
Chloride: 107 mmol/L (ref 98–111)
Creatinine, Ser: 1.12 mg/dL — ABNORMAL HIGH (ref 0.44–1.00)
GFR, Estimated: >60 mL/min (ref >60)
Glucose, Bld: 115 mg/dL — ABNORMAL HIGH (ref 70–99)
Potassium: 3.3 mmol/L — ABNORMAL LOW (ref 3.5–5.1)
Sodium: 138 mmol/L (ref 135–145)
Total Bilirubin: 0.6 mg/dL (ref 0.3–1.2)
Total Protein: 6.5 g/dL (ref 6.5–8.1)

## 2020-07-27 LAB — CBC WITH DIFFERENTIAL/PLATELET
Abs Immature Granulocytes: 0.02 10*3/uL (ref 0.00–0.07)
Basophils Absolute: 0 10*3/uL (ref 0.0–0.1)
Basophils Relative: 0 %
Eosinophils Absolute: 0.2 10*3/uL (ref 0.0–0.5)
Eosinophils Relative: 4 %
HCT: 47.4 % — ABNORMAL HIGH (ref 36.0–46.0)
Hemoglobin: 15.8 g/dL — ABNORMAL HIGH (ref 12.0–15.0)
Immature Granulocytes: 0 %
Lymphocytes Relative: 48 %
Lymphs Abs: 3 10*3/uL (ref 0.7–4.0)
MCH: 30.7 pg (ref 26.0–34.0)
MCHC: 33.3 g/dL (ref 30.0–36.0)
MCV: 92.2 fL (ref 80.0–100.0)
Monocytes Absolute: 0.5 10*3/uL (ref 0.1–1.0)
Monocytes Relative: 8 %
Neutro Abs: 2.5 10*3/uL (ref 1.7–7.7)
Neutrophils Relative %: 40 %
Platelets: 292 10*3/uL (ref 150–400)
RBC: 5.14 MIL/uL — ABNORMAL HIGH (ref 3.87–5.11)
RDW: 12.9 % (ref 11.5–15.5)
WBC: 6.2 10*3/uL (ref 4.0–10.5)
nRBC: 0 % (ref 0.0–0.2)

## 2020-07-27 MED ORDER — CEPHALEXIN 500 MG PO CAPS: 500.0000 mg | ORAL_CAPSULE | Freq: Three times a day (TID) | ORAL | 0 refills | Status: DC

## 2020-07-27 MED ORDER — PREDNISONE 20 MG PO TABS: ORAL_TABLET | ORAL | 0 refills | Status: DC

## 2020-07-27 NOTE — Discharge Instructions (Signed)
Please read and follow all provided instructions.  Your diagnoses today include:  1. Allergic contact dermatitis due to metals   2. Cellulitis of head except face   3. Swollen lymph nodes     Tests performed today include: Vital signs. See below for your results today.   Medications prescribed:  Keflex (cephalexin) - antibiotic  You have been prescribed an antibiotic medicine: take the entire course of medicine even if you are feeling better. Stopping early can cause the antibiotic not to work.  Prednisone - steroid medicine   It is best to take this medication in the morning to prevent sleeping problems. If you are diabetic, monitor your blood sugar closely and stop taking Prednisone if blood sugar is over 300. Take with food to prevent stomach upset.   Take any prescribed medications only as directed.   Home care instructions:  Follow any educational materials contained in this packet. Keep affected area above the level of your heart when possible. Wash area gently twice a day with warm soapy water. Do not apply alcohol or hydrogen peroxide. Cover the area if it draining or weeping.   Follow-up instructions: Please follow-up with your primary care provider in the next 1 week for further evaluation of your symptoms.   Return instructions:  Return to the Emergency Department if you have: Fever Worsening symptoms Worsening pain Worsening swelling Redness of the skin that moves away from the affected area, especially if it streaks away from the affected area  Any other emergent concerns  Your vital signs today were: BP (!) 136/98 (BP Location: Left Arm)   Pulse 84   Temp 98 F (36.7 C) (Oral)   Resp 16   Ht 5\' 3"  (1.6 m)   Wt 127 kg   SpO2 100%   BMI 49.60 kg/m  If your blood pressure (BP) was elevated above 135/85 this visit, please have this repeated by your doctor within one month. --------------

## 2020-07-27 NOTE — ED Provider Notes (Signed)
Emergency Medicine Provider Triage Evaluation Note  Amanda Gray , a 35 y.o. female  was evaluated in triage.  Pt complains of rash to bilateral ears, forearms, and chest.  Reports rash has been present x1 week.  Believes this is due to cleaning a friend's extremely dirty house.  Patient complains of pain swelling to left side of her face just below her ear.  Patient reports that pain and swelling have gradually worsened.  Patient endorses left ear pain.  Review of Systems  Positive: Rash, left ear pain, Negative: Fevers, chills, ear discharge, neck pain, neck stiffness  Physical Exam  BP 135/89 (BP Location: Left Arm)   Pulse 79   Temp 98 F (36.7 C) (Oral)   Resp 16   SpO2 100%  Gen:   Awake, no distress   Resp:  Normal effort  MSK:   Moves extremities without difficulty  Other:  Bilateral ears.  Patient has tenderness to left periorbital area, left TM is moderately erythematous.  Patient has swelling, fluctuance and tenderness below the left ear  Medical Decision Making  Medically screening exam initiated at 7:33 PM.  Appropriate orders placed.  Amanda Gray was informed that the remainder of the evaluation will be completed by another provider, this initial triage assessment does not replace that evaluation, and the importance of remaining in the ED until their evaluation is complete.  The patient appears stable so that the remainder of the work up may be completed by another provider.      Haskel Schroeder, PA-C 07/27/20 1936    Melene Plan, DO 07/27/20 2210

## 2020-07-27 NOTE — ED Triage Notes (Signed)
Pt c/o "reaction" to cleaner or feces from cleaning a house x1wk. Believes lymph nodes are swollen on L side, states ear is throbbing

## 2020-07-27 NOTE — ED Provider Notes (Signed)
[facility] EMERGENCY DEPARTMENT Provider Note   CSN: [id number] Arrival date & time: 07/27/20  1612     History Chief Complaint  Patient presents with   Allergic Reaction    Amanda Gray is a 35 y.o. female.  With history of nickel allergy presents the emergency department for evaluation of rash which is present on the bilateral ears, forearms, chest for about 1 week.  Patient reports wearing fake jewelry prior to symptom onset.  She has had some cracking on the skin on the ears and developed a swollen lymph node in her left neck.  No fevers, nausea or vomiting.  No history of diabetes or immunocompromise.  She has pain in her left ear with associated wound.  The onset of this condition was acute. The course is worsening. Aggravating factors: none. Alleviating factors: none.        Past Medical History:  Diagnosis Date   Back pain     There are no problems to display for this patient.   Past Surgical History:  Procedure Laterality Date   CESAREAN SECTION       OB History   No obstetric history on file.     No family history on file.  Social History   Tobacco Use   Smoking status: Every Day    Packs/day: 1.00    Pack years: 0.00    Types: Cigarettes   Smokeless tobacco: Never  Substance Use Topics   Alcohol use: No   Drug use: No    Home Medications Prior to Admission medications   Medication Sig Start Date End Date Taking? Authorizing Provider  cephALEXin (KEFLEX) 500 MG capsule Take 1 capsule (500 mg total) by mouth 3 (three) times daily. 07/27/20  Yes Renne Crigler, PA-C  predniSONE (DELTASONE) 20 MG tablet 3 Tabs PO Days 1-3, then 2 tabs PO Days 4-6, then 1 tab PO Day 7-9, then Half Tab PO Day 10-12 07/27/20  Yes Renne Crigler, PA-C  albuterol (PROAIR HFA) 108 (90 Base) MCG/ACT inhaler Inhale 2 puffs into the lungs every 4 (four) hours as needed for wheezing.     [provider]  fluticasone (FLOVENT HFA) 110 MCG/ACT inhaler  Inhale 1 puff into the lungs 2 (two) times daily.    [provider]    Allergies    Patient has no known allergies.  Review of Systems   Review of Systems  Constitutional:  Negative for fever.  HENT:  Negative for facial swelling.   Eyes:  Negative for redness.  Gastrointestinal:  Negative for nausea and vomiting.  Musculoskeletal:  Negative for myalgias.  Skin:  Positive for rash and wound.  Hematological:  Positive for adenopathy.  Psychiatric/Behavioral:  Negative for confusion.    Physical Exam Updated Vital Signs BP (!) 136/98 (BP Location: Left Arm)   Pulse 84   Temp 98 F (36.7 C) (Oral)   Resp 16   Ht 5\' 3"  (1.6 m)   Wt 127 kg   SpO2 100%   BMI 49.60 kg/m   Physical Exam Vitals and nursing note reviewed.  Constitutional:      Appearance: She is well-developed.  HENT:     Head: Normocephalic and atraumatic.     Right Ear: Tympanic membrane, ear canal and external ear normal.     Left Ear: Tympanic membrane, ear canal and external ear normal.  Eyes:     Conjunctiva/sclera: Conjunctivae normal.  Pulmonary:     Effort: No respiratory distress.  Musculoskeletal:  Cervical back: Normal range of motion and neck supple.  Skin:    General: Skin is warm and dry.     Comments: Patient with skin findings consistent with allergic contact dermatitis on the bilateral ears and scattered areas over the chest and bilateral arms.  On the ears, there is cracking of the skin and open wounds bilaterally, left greater than right.  Patient has associated auricular lymph node which is mildly tender.  No abscess noted.  Neurological:     Mental Status: She is alert.    ED Results / Procedures / Treatments   Labs (all labs ordered are listed, but only abnormal results are displayed) Labs Reviewed  COMPREHENSIVE METABOLIC PANEL - Abnormal; Notable for the following components:      Result Value   Potassium 3.3 (*)    CO2 21 (*)    Glucose, Bld 115 (*)     Creatinine, Ser 1.12 (*)    Calcium 8.7 (*)    Albumin 3.3 (*)    All other components within normal limits  CBC WITH DIFFERENTIAL/PLATELET - Abnormal; Notable for the following components:   RBC 5.14 (*)    Hemoglobin 15.8 (*)    HCT 47.4 (*)    All other components within normal limits    EKG None  Radiology CT Maxillofacial Wo Contrast  Result Date: 07/27/2020 CLINICAL DATA:  Facial pain and swelling. EXAM: CT MAXILLOFACIAL WITHOUT CONTRAST TECHNIQUE: Multidetector CT imaging of the maxillofacial structures was performed. Multiplanar CT image reconstructions were also generated. COMPARISON:  None. FINDINGS: Osseous: No fracture or mandibular dislocation. No destructive process. Orbits: Negative. No traumatic or inflammatory finding. Sinuses: Clear. Soft tissues: Negative. Limited intracranial: No significant or unexpected finding. IMPRESSION: No definite abnormality seen. Electronically Signed   By: Lupita Raider M.D.   On: 07/27/2020 20:07    Procedures Procedures   Medications Ordered in ED Medications - No data to display  ED Course  I have reviewed the triage vital signs and the nursing notes.  Pertinent labs & imaging results that were available during my care of the patient were reviewed by me and considered in my medical decision making (see chart for details).  Patient seen and examined.  Reviewed work-up ordered at triage.  Vital signs reviewed and are as follows: BP (!) 136/98 (BP Location: Left Arm)   Pulse 84   Temp 98 F (36.7 C) (Oral)   Resp 16   Ht 5\' 3"  (1.6 m)   Wt 127 kg   SpO2 100%   BMI 49.60 kg/m   Patient has allergic contact dermatitis likely due to metal.  Suspect bacterial superinfection, at least on the left ear causing reactive lymphadenopathy.  Patient appears well, nontoxic.  We will treat dermatitis with systemic steroids given extent of symptoms.  Will give Keflex to cover for localized cellulitis.  No signs of abscess.  Doubt staph  infection.    MDM Rules/Calculators/A&P                          Findings as above.   Final Clinical Impression(s) / ED Diagnoses Final diagnoses:  Allergic contact dermatitis due to metals  Cellulitis of head except face  Swollen lymph nodes    Rx / DC Orders ED Discharge Orders          Ordered    predniSONE (DELTASONE) 20 MG tablet        07/27/20 2045    cephALEXin (  KEFLEX) 500 MG capsule  3 times daily        07/27/20 2045             Renne Crigler, [place] 07/27/20 2059    Little, Ambrose Finland, MD 07/28/20 [phone]

## 2020-09-18 ENCOUNTER — Encounter (HOSPITAL_COMMUNITY): Payer: Self-pay

## 2020-09-18 ENCOUNTER — Emergency Department (HOSPITAL_COMMUNITY)
Admission: EM | Admit: 2020-09-18 | Discharge: 2020-09-18 | Disposition: A | Discharge status: Home / Self Care | Payer: Medicaid Other | Attending: Emergency Medicine | Admitting: Emergency Medicine

## 2020-09-18 ENCOUNTER — Other Ambulatory Visit: Payer: Self-pay

## 2020-09-18 DIAGNOSIS — R519 Headache, unspecified: Secondary | ICD-10-CM | POA: Diagnosis not present

## 2020-09-18 DIAGNOSIS — R300 Dysuria: Secondary | ICD-10-CM | POA: Diagnosis not present

## 2020-09-18 DIAGNOSIS — N739 Female pelvic inflammatory disease, unspecified: Secondary | ICD-10-CM

## 2020-09-18 DIAGNOSIS — M545 Low back pain, unspecified: Secondary | ICD-10-CM | POA: Diagnosis not present

## 2020-09-18 DIAGNOSIS — F17210 Nicotine dependence, cigarettes, uncomplicated: Secondary | ICD-10-CM | POA: Insufficient documentation

## 2020-09-18 DIAGNOSIS — J45909 Unspecified asthma, uncomplicated: Secondary | ICD-10-CM | POA: Insufficient documentation

## 2020-09-18 DIAGNOSIS — R059 Cough, unspecified: Secondary | ICD-10-CM | POA: Diagnosis not present

## 2020-09-18 DIAGNOSIS — R1030 Lower abdominal pain, unspecified: Secondary | ICD-10-CM | POA: Diagnosis not present

## 2020-09-18 HISTORY — DX: Other seasonal allergic rhinitis: J30.2

## 2020-09-18 HISTORY — DX: Unspecified asthma, uncomplicated: J45.909

## 2020-09-18 LAB — URINALYSIS, ROUTINE W REFLEX MICROSCOPIC
Bilirubin Urine: NEGATIVE
Glucose, UA: NEGATIVE mg/dL
Ketones, ur: NEGATIVE mg/dL
Nitrite: NEGATIVE
Protein, ur: 30 mg/dL — AB
RBC / HPF: >50 RBC/hpf — ABNORMAL HIGH (ref 0–5)
Specific Gravity, Urine: 1.026 (ref 1.005–1.030)
WBC, UA: >50 WBC/hpf — ABNORMAL HIGH (ref 0–5)
pH: 5 (ref 5.0–8.0)

## 2020-09-18 LAB — CBC WITH DIFFERENTIAL/PLATELET
Abs Immature Granulocytes: 0.03 10*3/uL (ref 0.00–0.07)
Basophils Absolute: 0 10*3/uL (ref 0.0–0.1)
Basophils Relative: 0 %
Eosinophils Absolute: 0.3 10*3/uL (ref 0.0–0.5)
Eosinophils Relative: 2 %
HCT: 44.3 % (ref 36.0–46.0)
Hemoglobin: 14.7 g/dL (ref 12.0–15.0)
Immature Granulocytes: 0 %
Lymphocytes Relative: 20 %
Lymphs Abs: 2.3 10*3/uL (ref 0.7–4.0)
MCH: 31.3 pg (ref 26.0–34.0)
MCHC: 33.2 g/dL (ref 30.0–36.0)
MCV: 94.5 fL (ref 80.0–100.0)
Monocytes Absolute: 0.8 10*3/uL (ref 0.1–1.0)
Monocytes Relative: 7 %
Neutro Abs: 7.8 10*3/uL — ABNORMAL HIGH (ref 1.7–7.7)
Neutrophils Relative %: 71 %
Platelets: 183 10*3/uL (ref 150–400)
RBC: 4.69 MIL/uL (ref 3.87–5.11)
RDW: 13.3 % (ref 11.5–15.5)
WBC: 11.1 10*3/uL — ABNORMAL HIGH (ref 4.0–10.5)
nRBC: 0 % (ref 0.0–0.2)

## 2020-09-18 LAB — WET PREP, GENITAL
Clue Cells Wet Prep HPF POC: NONE SEEN
Sperm: NONE SEEN
Trich, Wet Prep: NONE SEEN
Yeast Wet Prep HPF POC: NONE SEEN

## 2020-09-18 LAB — COMPREHENSIVE METABOLIC PANEL
ALT: 11 U/L (ref 0–44)
AST: 14 U/L — ABNORMAL LOW (ref 15–41)
Albumin: 3.1 g/dL — ABNORMAL LOW (ref 3.5–5.0)
Alkaline Phosphatase: 74 U/L (ref 38–126)
Anion gap: 6 (ref 5–15)
BUN: 7 mg/dL (ref 6–20)
CO2: 24 mmol/L (ref 22–32)
Calcium: 8.3 mg/dL — ABNORMAL LOW (ref 8.9–10.3)
Chloride: 107 mmol/L (ref 98–111)
Creatinine, Ser: 1.14 mg/dL — ABNORMAL HIGH (ref 0.44–1.00)
GFR, Estimated: >60 mL/min (ref >60)
Glucose, Bld: 117 mg/dL — ABNORMAL HIGH (ref 70–99)
Potassium: 3.6 mmol/L (ref 3.5–5.1)
Sodium: 137 mmol/L (ref 135–145)
Total Bilirubin: 0.9 mg/dL (ref 0.3–1.2)
Total Protein: 6.4 g/dL — ABNORMAL LOW (ref 6.5–8.1)

## 2020-09-18 LAB — PREGNANCY, URINE: Preg Test, Ur: NEGATIVE

## 2020-09-18 MED ORDER — ALBUTEROL SULFATE HFA 108 (90 BASE) MCG/ACT IN AERS
2.0000 | INHALATION_SPRAY | Freq: Once | RESPIRATORY_TRACT | Status: AC
  Administered 2020-09-18: 2 via RESPIRATORY_TRACT
  Filled 2020-09-18: qty 6.7

## 2020-09-18 MED ORDER — DOXYCYCLINE HYCLATE 100 MG PO CAPS: 100.0000 mg | ORAL_CAPSULE | Freq: Two times a day (BID) | ORAL | 0 refills | Status: AC

## 2020-09-18 MED ORDER — CEFTRIAXONE SODIUM 500 MG IJ SOLR
500.0000 mg | Freq: Once | INTRAMUSCULAR | Status: AC
  Administered 2020-09-18: 500 mg via INTRAMUSCULAR
  Filled 2020-09-18: qty 500

## 2020-09-18 MED ORDER — DOXYCYCLINE HYCLATE 100 MG PO TABS
100.0000 mg | ORAL_TABLET | Freq: Once | ORAL | Status: AC
  Administered 2020-09-18: 100 mg via ORAL
  Filled 2020-09-18: qty 1

## 2020-09-18 MED ORDER — LIDOCAINE HCL (PF) 1 % IJ SOLN
INTRAMUSCULAR | Status: AC
  Administered 2020-09-18: 1 mL via INTRAMUSCULAR
  Filled 2020-09-18: qty 5

## 2020-09-18 NOTE — ED Provider Notes (Signed)
[facility] EMERGENCY DEPARTMENT Provider Note   CSN: [id number] Arrival date & time: 09/18/20  7782     History Chief Complaint  Patient presents with   Dysuria   Back Pain   Headache    Amanda Gray is a 35 y.o. female. Two months of bilateral back pain, she reports  8/10 difficult to describe worsened when she drinks alcohol and soda improved when she drinks water. She reports that the pain is in her mid back bilaterally.  It does not radiate anywhere.  Its not worsened with movement.  She does not have any focal weakness no numbness or tingling.  Not having any bowel or bladder incontinence.  She is not currently having back pain. She is reporting 1 week of burning with urination and "boil" that popped last night. She reports some lower abd/suprapubic pain. Denies fever, no nausea, vomiting, diarrhea.  patient is a sexual relationship with a nonmonogamous female.  She does not use any forms of barrier protection with him.  She denies any discharge, no change in urine appearance, no blood. She also reports a headache for 2 days which has resolved after taking a 5mg  lortab that she got from her mother. Endorses slight cough.    Dysuria Associated symptoms: no fever, no nausea, no vaginal discharge and no vomiting   Back Pain Associated symptoms: dysuria and headaches   Associated symptoms: no fever, no numbness and no weakness   Headache Associated symptoms: back pain and cough   Associated symptoms: no diarrhea, no fever, no nausea, no numbness, no vomiting and no weakness       Past Medical History:  Diagnosis Date   Asthma    Back pain    Seasonal allergies     There are no problems to display for this patient.   Past Surgical History:  Procedure Laterality Date   CESAREAN SECTION     CESAREAN SECTION       OB History   No obstetric history on file.     No family history on file.  Social History   Tobacco Use   Smoking status: Every Day     Packs/day: 1.00    Types: Cigarettes   Smokeless tobacco: Never  Substance Use Topics   Alcohol use: No   Drug use: No    Home Medications Prior to Admission medications   Medication Sig Start Date End Date Taking? Authorizing Provider  albuterol (PROAIR HFA) 108 (90 Base) MCG/ACT inhaler Inhale 2 puffs into the lungs every 4 (four) hours as needed for wheezing.     [provider]  cephALEXin (KEFLEX) 500 MG capsule Take 1 capsule (500 mg total) by mouth 3 (three) times daily. 07/27/20   07/29/20, PA-C  fluticasone (FLOVENT HFA) 110 MCG/ACT inhaler Inhale 1 puff into the lungs 2 (two) times daily.    [provider]  predniSONE (DELTASONE) 20 MG tablet 3 Tabs PO Days 1-3, then 2 tabs PO Days 4-6, then 1 tab PO Day 7-9, then Half Tab PO Day 10-12 07/27/20   07/29/20, PA-C    Allergies    Patient has no known allergies.  Review of Systems   Review of Systems  Constitutional:  Negative for chills and fever.  HENT: Negative.    Eyes: Negative.   Respiratory:  Positive for cough.   Cardiovascular: Negative.   Gastrointestinal:  Negative for diarrhea, nausea and vomiting.  Genitourinary:  Positive for dysuria, genital sores and vaginal bleeding. Negative  for decreased urine volume, hematuria, urgency, vaginal discharge and vaginal pain.  Musculoskeletal:  Positive for back pain.  Neurological:  Positive for headaches. Negative for weakness and numbness.  Psychiatric/Behavioral: Negative.     Physical Exam Updated Vital Signs BP (!) 139/108 (BP Location: Left Arm)   Pulse 89   Temp 98.9 F (37.2 C) (Oral)   Resp 17   Ht 5\' 3"  (1.6 m)   Wt 99.3 kg   SpO2 98%   BMI 38.79 kg/m   Physical Exam Exam conducted with a chaperone present.  Constitutional:      General: She is not in acute distress.    Appearance: She is obese. She is not ill-appearing, toxic-appearing or diaphoretic.  HENT:     Head: Normocephalic and atraumatic.  Eyes:      Extraocular Movements: Extraocular movements intact.     Pupils: Pupils are equal, round, and reactive to light.  Cardiovascular:     Rate and Rhythm: Normal rate and regular rhythm.     Heart sounds: Normal heart sounds.  Pulmonary:     Effort: Pulmonary effort is normal. No respiratory distress.     Breath sounds: Normal breath sounds. No wheezing.  Abdominal:     General: Bowel sounds are normal.     Palpations: Abdomen is soft.     Tenderness: There is abdominal tenderness.     Hernia: There is no hernia in the left inguinal area or right inguinal area.     Comments: Suprapubic tenderness  Genitourinary:    Exam position: Supine.     Labia:        Right: Lesion present. No rash or tenderness.        Left: No rash, tenderness or lesion.      Comments: Moderate white discharge from cervix. No cervical motion tenderness. No adenexal tendernes. Suprapubic tenderness noted. Musculoskeletal:        General: Normal range of motion.     Cervical back: Normal range of motion and neck supple.  Lymphadenopathy:     Lower Body: No right inguinal adenopathy.  Skin:    General: Skin is warm and dry.  Neurological:     Mental Status: She is alert and oriented to person, place, and time. Mental status is at baseline.    ED Results / Procedures / Treatments   Labs (all labs ordered are listed, but only abnormal results are displayed) Labs Reviewed  URINALYSIS, ROUTINE W REFLEX MICROSCOPIC  CBC WITH DIFFERENTIAL/PLATELET  COMPREHENSIVE METABOLIC PANEL    EKG None  Radiology No results found.  Procedures Pelvic exam  Date/Time: 09/18/2020 9:32 PM Performed by: 09/20/2020, MD Authorized by: Adron Bene, DO  Consent: Verbal consent obtained. Consent given by: patient Patient understanding: patient states understanding of the procedure being performed Patient consent: the patient's understanding of the procedure matches consent given Procedure consent: procedure consent  matches procedure scheduled Patient identity confirmed: verbally with patient Local anesthesia used: no  Anesthesia: Local anesthesia used: no  Sedation: Patient sedated: no  Patient tolerance: patient tolerated the procedure well with no immediate complications Comments: Moderate amount of white discharge noted in vagina around cervical os. No significant erythema or edema. No cervical motion tenderness or adnexal tenderness.       Medications Ordered in ED Medications - No data to display  ED Course  I have reviewed the triage vital signs and the nursing notes.  Pertinent labs & imaging results that were available during my care of the  patient were reviewed by me and considered in my medical decision making (see chart for details).    MDM Rules/Calculators/A&P                          35 year old female who presented to the emergency department with complaints of suprapubic/lower abdominal pain as well as burning with urination for the past week.  Also reported back pain for the past 2 months related to alcohol use.  UA was obtained and showed large hemoglobin and urine negative for nitrites large leukocyte.  White blood cells mucus and bacteria were also present.  CBC showed only mild elevation of white blood cell count to 11.1.  Metabolic panel showed only mild elevation of creatinine up to 1.14.  Pelvic exam was performed and GC chlamydia swab and wet prep were obtained.  She was given a dose of 500 mg of ceftriaxone intramuscularly as well as 1 dose of 100 mg doxycycline while in the emergency department.  Genital wet prep showed no trichomoniasis or clue cells. Suspect mild PID. She was discharged with a course of doxycycline 100 mg twice daily for 14 days.  Instructions provided to return if symptoms do not improve or worsen.  Final Clinical Impression(s) / ED Diagnoses Final diagnoses:  None    Rx / DC Orders ED Discharge Orders     None        Adron Bene,  MD 09/18/20 2142    Melene Plan, DO 09/19/20 [id number]

## 2020-09-18 NOTE — Discharge Instructions (Addendum)
Dear Mrs. Mannis,  Thank you for trusting [place] with your care today.   Today we evaluated you for lower abdominal pain and burning with urination.  We obtained a urinalysis showed some signs of a infection.  He was reporting some lower abdominal pain to so we performed a pelvic exam and noted significant discharge which we took samples of.  We we will start you on some antibiotics.  Please take the 100 mg doxycycline twice daily for 14 days.  Once your GC/chlamydia lab results have returned you will receive a call.  If these results are positive please inform any recent sexual partners that you have had that they need to seek treatment.  If your symptoms do not improve or worsen please return for further evaluation.

## 2020-09-18 NOTE — ED Triage Notes (Signed)
Pt arrives via POV with multiple complaints. Pt reports headache, took advil with no relief, then took mother's Lortab 5, Pt reports pain with urination, very yellow urine, back pain.

## 2020-09-19 LAB — GC/CHLAMYDIA PROBE AMP (~~LOC~~) NOT AT ARMC
Chlamydia: NEGATIVE
Comment: NEGATIVE
Comment: NORMAL
Neisseria Gonorrhea: NEGATIVE

## 2021-03-18 ENCOUNTER — Emergency Department (HOSPITAL_COMMUNITY)
Admission: EM | Admit: 2021-03-18 | Discharge: 2021-03-18 | Disposition: A | Discharge status: Home / Self Care | Payer: Medicaid Other | Attending: Emergency Medicine | Admitting: Emergency Medicine

## 2021-03-18 ENCOUNTER — Encounter (HOSPITAL_COMMUNITY): Payer: Self-pay

## 2021-03-18 ENCOUNTER — Other Ambulatory Visit: Payer: Self-pay

## 2021-03-18 DIAGNOSIS — L259 Unspecified contact dermatitis, unspecified cause: Secondary | ICD-10-CM | POA: Diagnosis not present

## 2021-03-18 DIAGNOSIS — R1114 Bilious vomiting: Secondary | ICD-10-CM

## 2021-03-18 DIAGNOSIS — R112 Nausea with vomiting, unspecified: Secondary | ICD-10-CM | POA: Diagnosis present

## 2021-03-18 DIAGNOSIS — J45909 Unspecified asthma, uncomplicated: Secondary | ICD-10-CM | POA: Diagnosis not present

## 2021-03-18 DIAGNOSIS — R1013 Epigastric pain: Secondary | ICD-10-CM | POA: Diagnosis not present

## 2021-03-18 DIAGNOSIS — D72829 Elevated white blood cell count, unspecified: Secondary | ICD-10-CM | POA: Insufficient documentation

## 2021-03-18 LAB — COMPREHENSIVE METABOLIC PANEL
ALT: 13 U/L (ref 0–44)
AST: 16 U/L (ref 15–41)
Albumin: 3.5 g/dL (ref 3.5–5.0)
Alkaline Phosphatase: 73 U/L (ref 38–126)
Anion gap: 11 (ref 5–15)
BUN: 7 mg/dL (ref 6–20)
CO2: 24 mmol/L (ref 22–32)
Calcium: 8.8 mg/dL — ABNORMAL LOW (ref 8.9–10.3)
Chloride: 100 mmol/L (ref 98–111)
Creatinine, Ser: 1.04 mg/dL — ABNORMAL HIGH (ref 0.44–1.00)
GFR, Estimated: >60 mL/min (ref >60)
Glucose, Bld: 108 mg/dL — ABNORMAL HIGH (ref 70–99)
Potassium: 3.5 mmol/L (ref 3.5–5.1)
Sodium: 135 mmol/L (ref 135–145)
Total Bilirubin: 0.7 mg/dL (ref 0.3–1.2)
Total Protein: 6.9 g/dL (ref 6.5–8.1)

## 2021-03-18 LAB — CBC
HCT: 51.6 % — ABNORMAL HIGH (ref 36.0–46.0)
Hemoglobin: 16.8 g/dL — ABNORMAL HIGH (ref 12.0–15.0)
MCH: 30.1 pg (ref 26.0–34.0)
MCHC: 32.6 g/dL (ref 30.0–36.0)
MCV: 92.5 fL (ref 80.0–100.0)
Platelets: 289 10*3/uL (ref 150–400)
RBC: 5.58 MIL/uL — ABNORMAL HIGH (ref 3.87–5.11)
RDW: 13.1 % (ref 11.5–15.5)
WBC: 6.1 10*3/uL (ref 4.0–10.5)
nRBC: 0 % (ref 0.0–0.2)

## 2021-03-18 LAB — URINALYSIS, ROUTINE W REFLEX MICROSCOPIC
Bilirubin Urine: NEGATIVE
Glucose, UA: NEGATIVE mg/dL
Ketones, ur: NEGATIVE mg/dL
Nitrite: NEGATIVE
Protein, ur: NEGATIVE mg/dL
Specific Gravity, Urine: 1.01 (ref 1.005–1.030)
pH: 8.5 — ABNORMAL HIGH (ref 5.0–8.0)

## 2021-03-18 LAB — URINALYSIS, MICROSCOPIC (REFLEX): Bacteria, UA: NONE SEEN

## 2021-03-18 LAB — LIPASE, BLOOD: Lipase: 52 U/L — ABNORMAL HIGH (ref 11–51)

## 2021-03-18 LAB — I-STAT BETA HCG BLOOD, ED (MC, WL, AP ONLY): I-stat hCG, quantitative: <5 m[IU]/mL (ref <5)

## 2021-03-18 MED ORDER — ONDANSETRON HCL 4 MG/2ML IJ SOLN: 4.0000 mg | Freq: Once | INTRAMUSCULAR | Status: AC | PRN

## 2021-03-18 MED ORDER — ONDANSETRON HCL 4 MG PO TABS: 4.0000 mg | ORAL_TABLET | Freq: Four times a day (QID) | ORAL | 0 refills | Status: DC

## 2021-03-18 MED ORDER — ALBUTEROL SULFATE HFA 108 (90 BASE) MCG/ACT IN AERS: 1.0000 | INHALATION_SPRAY | RESPIRATORY_TRACT | 0 refills | Status: DC | PRN

## 2021-03-18 MED ORDER — ONDANSETRON HCL 4 MG/2ML IJ SOLN
4.0000 mg | Freq: Once | INTRAMUSCULAR | Status: AC
  Administered 2021-03-18: 4 mg via INTRAVENOUS
  Filled 2021-03-18: qty 2

## 2021-03-18 MED ORDER — ONDANSETRON HCL 4 MG/2ML IJ SOLN
INTRAMUSCULAR | Status: AC
  Administered 2021-03-18: 4 mg via INTRAVENOUS
  Filled 2021-03-18: qty 2

## 2021-03-18 MED ORDER — LIDOCAINE VISCOUS HCL 2 % MT SOLN
15.0000 mL | Freq: Once | OROMUCOSAL | Status: AC
  Administered 2021-03-18: 15 mL via ORAL
  Filled 2021-03-18: qty 15

## 2021-03-18 MED ORDER — DICYCLOMINE HCL 10 MG PO CAPS
10.0000 mg | ORAL_CAPSULE | Freq: Once | ORAL | Status: AC
  Administered 2021-03-18: 10 mg via ORAL
  Filled 2021-03-18: qty 1

## 2021-03-18 MED ORDER — TRIAMCINOLONE ACETONIDE 0.1 % EX CREA: 1.0000 "application " | TOPICAL_CREAM | Freq: Two times a day (BID) | CUTANEOUS | 0 refills | Status: DC

## 2021-03-18 MED ORDER — PANTOPRAZOLE SODIUM 40 MG IV SOLR
40.0000 mg | INTRAVENOUS | Status: AC
  Administered 2021-03-18: 40 mg via INTRAVENOUS
  Filled 2021-03-18: qty 10

## 2021-03-18 MED ORDER — PREDNISONE 20 MG PO TABS: ORAL_TABLET | ORAL | 0 refills | Status: DC

## 2021-03-18 MED ORDER — OMEPRAZOLE 20 MG PO CPDR: 20.0000 mg | DELAYED_RELEASE_CAPSULE | Freq: Every day | ORAL | 0 refills | Status: DC

## 2021-03-18 MED ORDER — ALUM & MAG HYDROXIDE-SIMETH 200-200-20 MG/5ML PO SUSP
30.0000 mL | Freq: Once | ORAL | Status: AC
  Administered 2021-03-18: 30 mL via ORAL
  Filled 2021-03-18: qty 30

## 2021-03-18 NOTE — Discharge Instructions (Addendum)
You were seen here today for evaluation of your nausea and vomiting along with your abdominal pain, rash, and medication refills.  I am sending you in omeprazole and Zofran for your abdominal pain/nausea and vomiting.  The omeprazole you need to take daily for the next 2 weeks to help with this pain.  You can take the Zofran every 6 hours as needed for nausea and vomiting.  Per your request, I have sent in a prescription for prednisone and triamcinolone cream for your rash on your arm.  Have also refilled your albuterol inhaler.  I have included additional information on chronic dermatitis, bland diet, and abdominal pain and nausea and vomiting.  Reasons to return to the ER include worsening abdominal pain, vomiting blood, vomiting coffee grounds, fever, and/or nausea and vomiting not controlled with Zofran.  If you have any concerns, new or worsening symptoms, please return to the nearest emergency department for reevaluation.  Additionally, I have included information for the [place] and wellness center for you to call to schedule an appointment to establish a primary care provider.

## 2021-03-18 NOTE — ED Provider Notes (Signed)
[facility] EMERGENCY DEPARTMENT Provider Note   CSN: [id number] Arrival date & time: 03/18/21  1244     History  Chief Complaint  Patient presents with   Abdominal Pain    Amanda Gray is a 36 y.o. female with medical history significant for asthma, back pain, allergies.  Patient presents to ED for evaluation of nausea and vomiting that she is experienced this morning 3 times.  Patient reports that on Friday night, she was in her usual state of health when she decided to indulge in alcoholic spirits.  Patient reports that beginning Saturday night, she began experiencing nausea and vomiting 3 times.  Patient reports that the vomit was nonbloody, nonbilious.  Patient states at this time, she decided to present to ED for evaluation.  Patient is endorsing nausea and vomiting, abdominal pain.  Patient denies fevers, diarrhea, lightheadedness, dizziness, weakness, chills, sick contacts.   Abdominal Pain Associated symptoms: nausea and vomiting   Associated symptoms: no chills, no diarrhea and no fever       Home Medications Prior to Admission medications   Medication Sig Start Date End Date Taking? Authorizing Provider  albuterol (VENTOLIN HFA) 108 (90 Base) MCG/ACT inhaler Inhale 2 puffs into the lungs every 4 (four) hours as needed for wheezing.     [provider]  cephALEXin (KEFLEX) 500 MG capsule Take 1 capsule (500 mg total) by mouth 3 (three) times daily. Patient not taking: No sig reported 07/27/20   Carlisle Cater, PA-C  predniSONE (DELTASONE) 20 MG tablet 3 Tabs PO Days 1-3, then 2 tabs PO Days 4-6, then 1 tab PO Day 7-9, then Half Tab PO Day 10-12 Patient not taking: No sig reported 07/27/20   Carlisle Cater, PA-C      Allergies    Patient has no known allergies.    Review of Systems   Review of Systems  Constitutional:  Negative for chills and fever.  Gastrointestinal:  Positive for abdominal pain, nausea and vomiting. Negative for blood in  stool and diarrhea.  Neurological:  Negative for dizziness, weakness and light-headedness.  All other systems reviewed and are negative.  Physical Exam Updated Vital Signs BP (!) 153/112 (BP Location: Right Arm)    Pulse 77    Temp 98.3 F (36.8 C) (Oral)    Resp 16    SpO2 100%  Physical Exam Vitals and nursing note reviewed.  Constitutional:      General: She is not in acute distress.    Appearance: She is well-developed. She is not ill-appearing, toxic-appearing or diaphoretic.  HENT:     Head: Normocephalic and atraumatic.     Nose: Nose normal.     Mouth/Throat:     Mouth: Mucous membranes are moist.  Eyes:     Extraocular Movements: Extraocular movements intact.     Pupils: Pupils are equal, round, and reactive to light.  Cardiovascular:     Rate and Rhythm: Normal rate and regular rhythm.  Pulmonary:     Effort: Pulmonary effort is normal.     Breath sounds: Normal breath sounds. No wheezing or rales.  Abdominal:     General: Abdomen is flat. Bowel sounds are normal.     Palpations: Abdomen is soft. There is no splenomegaly.     Tenderness: There is abdominal tenderness in the epigastric area.  Musculoskeletal:     Cervical back: Normal range of motion and neck supple. No rigidity or tenderness.  Skin:    General: Skin is warm and  dry.     Capillary Refill: Capillary refill takes less than 2 seconds.     Findings: Rash present. Rash is scaling.     Comments: 5 cm area of scaling rash noted to left forearm  Neurological:     Mental Status: She is alert and oriented to person, place, and time.    ED Results / Procedures / Treatments   Labs (all labs ordered are listed, but only abnormal results are displayed) Labs Reviewed  LIPASE, BLOOD - Abnormal; Notable for the following components:      Result Value   Lipase 52 (*)    All other components within normal limits  COMPREHENSIVE METABOLIC PANEL - Abnormal; Notable for the following components:   Glucose, Bld 108  (*)    Creatinine, Ser 1.04 (*)    Calcium 8.8 (*)    All other components within normal limits  CBC - Abnormal; Notable for the following components:   RBC 5.58 (*)    Hemoglobin 16.8 (*)    HCT 51.6 (*)    All other components within normal limits  URINALYSIS, ROUTINE W REFLEX MICROSCOPIC - Abnormal; Notable for the following components:   pH 8.5 (*)    Hgb urine dipstick SMALL (*)    Leukocytes,Ua TRACE (*)    All other components within normal limits  URINALYSIS, MICROSCOPIC (REFLEX)  I-STAT BETA HCG BLOOD, ED ([facility], [facility], [facility] ONLY)    EKG None  Radiology No results found.  Procedures Procedures    Medications Ordered in ED Medications  ondansetron (ZOFRAN) injection 4 mg (4 mg Intravenous Given 03/18/21 1345)  dicyclomine (BENTYL) capsule 10 mg (10 mg Oral Given 03/18/21 1444)    ED Course/ Medical Decision Making/ A&P                           Medical Decision Making Amount and/or Complexity of Data Reviewed Labs: ordered.  Risk Prescription drug management.   36 year old female presents for evaluation of nausea and vomiting since last night.  On examination, patient is afebrile, nontachycardic, nonhypoxic, clear lung sounds bilaterally, soft possible abdomen with epigastric abdominal pain.  Patient nontoxic in appearance.  Patient will be evaluated utilizing following imaging studies and labs interpreted by me: Lipase elevated at 52. CBC unremarkable CMP consistent with patient baseline  UA shows small hemoglobin and urine, trace leukocytes  At this time, patient is 1 L of fluid hanging, has already been treated utilizing 4 mg Zofran.  Additional treatments include 40 mg Protonix, GI cocktail.  Patient also requesting albuterol inhaler refill as well as prednisone prescription due to contact dermatitis of left forearm.  At the end of my shift, patient work-up not yet completed.  Patient be signed out to [facility] for further management.  Plan at this  time is to have patient pass p.o. fluid challenge, discharge the patient with PPI prescription and outpatient follow-up with return precautions.   Final Clinical Impression(s) / ED Diagnoses Final diagnoses:  None    Rx / DC Orders ED Discharge Orders     None         Azucena Cecil, PA-C 03/18/21 1544    Horton, Alvin Critchley, DO 03/18/21 1557

## 2021-03-18 NOTE — ED Triage Notes (Addendum)
Patient complains of nausea and vomiting that started Saturday am on awakening. Denies diarrhea. States that she normally drinks ETOH and did drink Friday night. Complains of epigastric pain and cramping. Smoker. Alert and oriented

## 2021-03-18 NOTE — ED Notes (Signed)
Pt verbalized understanding of d/c instructions, meds and followup care. Denies questions. VSS, no distress noted. Steady gait to exit with all belongings.  ?

## 2021-03-18 NOTE — ED Provider Notes (Signed)
°  Physical Exam  BP (!) 153/112 (BP Location: Right Arm)    Pulse 77    Temp 98.3 F (36.8 C) (Oral)    Resp 16    SpO2 100%   Physical Exam Vitals and nursing note reviewed.  Constitutional:      General: She is not in acute distress.    Appearance: Normal appearance. She is not ill-appearing or diaphoretic.  Eyes:     General: No scleral icterus. Pulmonary:     Effort: Pulmonary effort is normal. No respiratory distress.  Abdominal:     General: Bowel sounds are normal. There is no distension.     Tenderness: There is abdominal tenderness in the epigastric area. There is no guarding or rebound.     Comments: Mild epigastric tenderness without guarding or rebound. NBS.   Skin:    General: Skin is dry.     Comments: Darkened scar noted to the patient's left forearm with excoriation marks to the more proximal end. No skin sloughing. No vesicles.   Neurological:     General: No focal deficit present.     Mental Status: She is alert. Mental status is at baseline.  Psychiatric:        Mood and Affect: Mood normal.     Procedures  Procedures  ED Course / MDM    Medical Decision Making Amount and/or Complexity of Data Reviewed Labs: ordered.  Risk OTC drugs. Prescription drug management.   Accepted handoff at shift change from Delice Bison, PA-C. Please see prior provider note for more detail.   Briefly: Patient is 36 y.o.   DDX: concern for alcohol induced gastritis.   Plan: Await GI cocktail and fluids and reassess for pain.  On re-evaluation, the patient was comfortable appearing. Empty emesis bag. The patient reports that she vomited before I saw her, but her stomach is feeling much better. I see that all the medications are already ordered, but none have been given yet. I messaged nursing staff to see if they could administer more promptly. The patient was concerned about the rash that she has on her left forearm. The darkened area seen on the picture above  appears to be a scar, but at the more proximal end, she has some excoriation marks.   Again on re-evaluation, the patient reports she feels nauseous, but no vomiting. She was able to eat a few crackers. Abdominal exam unchanged.   The patient has not had any episodes of vomiting and reports she is feeling better after the medications. The patient is requesting antibiotics and a steroid for her rash. I don't think an antibiotic is needed as if doesn't look infectious. Will give oral prednisone as she said this worked for her last time and topical steroid. Refilled her inhaler. Prescribed her omeprazole and Zofran for her symptoms today. Strict return precautions were discussed. The patient agrees to plan. The patient is stable and being discharged home in good condition.      Achille Rich, PA-C 03/20/21 2054    Terald Sleeper, MD 03/21/21 1356

## 2021-07-10 IMAGING — CT CT MAXILLOFACIAL W/O CM
3 series · 17 of 47 positions shown, 20 images · non-contrast
Comparison: None.

CLINICAL DATA: Facial pain and swelling.

EXAM:
CT MAXILLOFACIAL WITHOUT CONTRAST
TECHNIQUE: Multidetector CT imaging of the maxillofacial structures was
performed. Multiplanar CT image reconstructions were also generated.

[Series 3: facial/ orbits 2.0 h30s · axial · 0.32mm/px · z∈[-145,-9]mm · 11 of 80 slices shown, 14 images]
[im 6/80  brain]
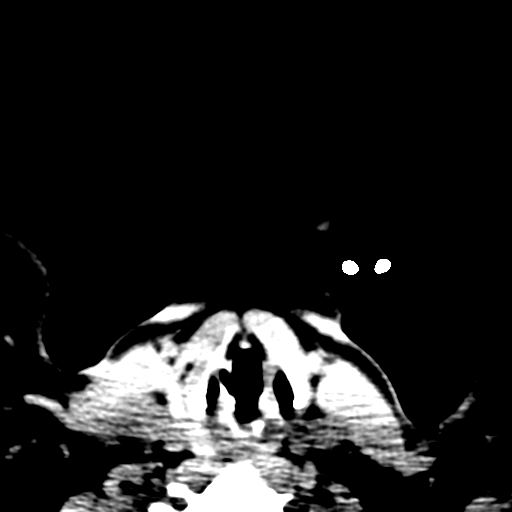
[im 6/80  bone]
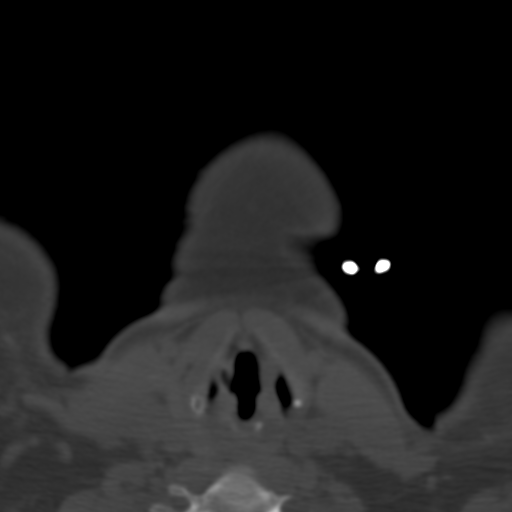
[im 11/80  bone]
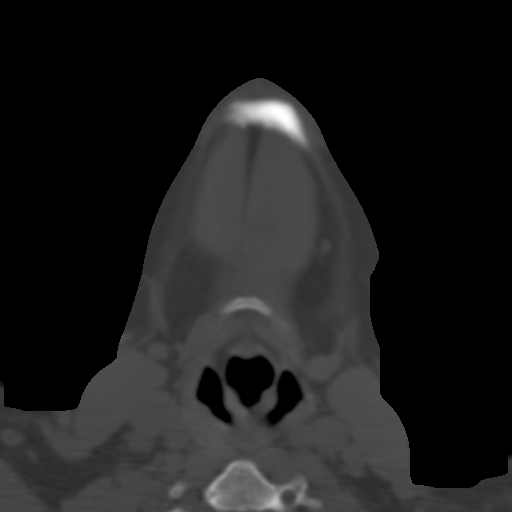
[im 20/80  bone]
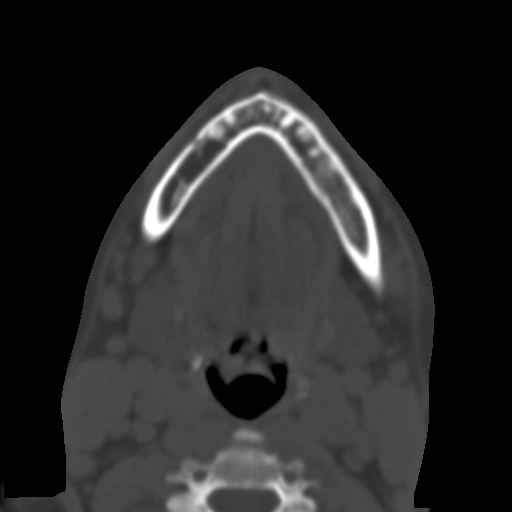
[im 25/80  bone]
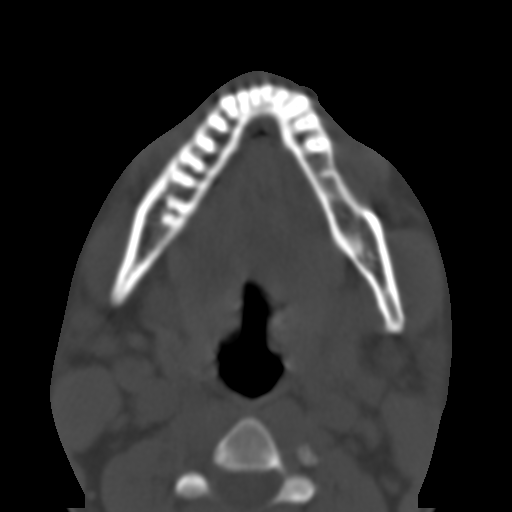
[im 33/80  brain]
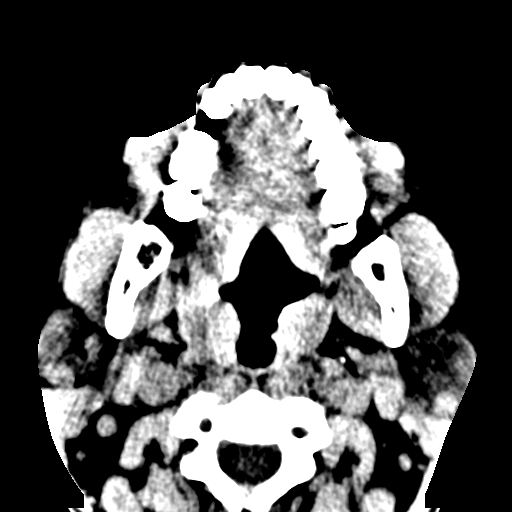
[im 33/80  bone]
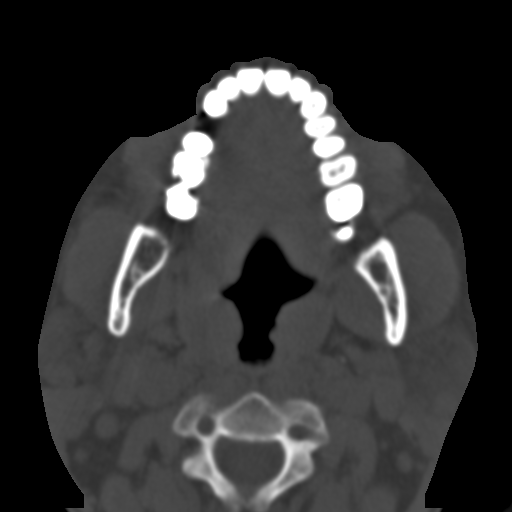
[im 41/80  bone]
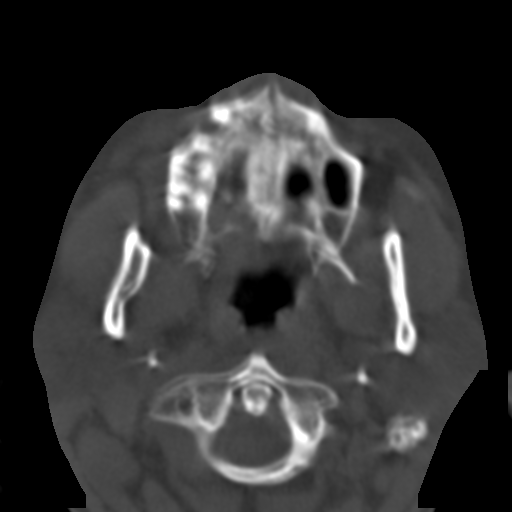
[im 47/80  bone]
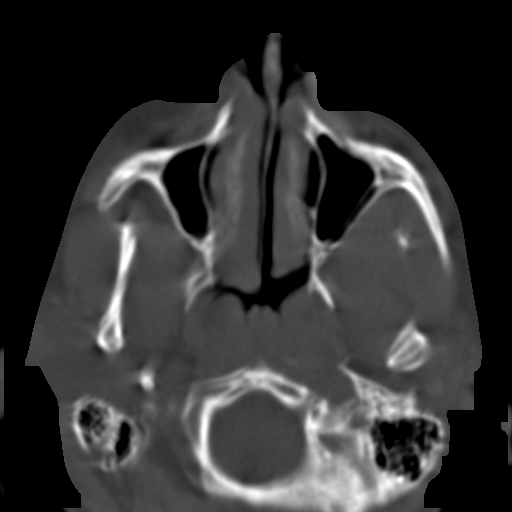
[im 55/80  bone]
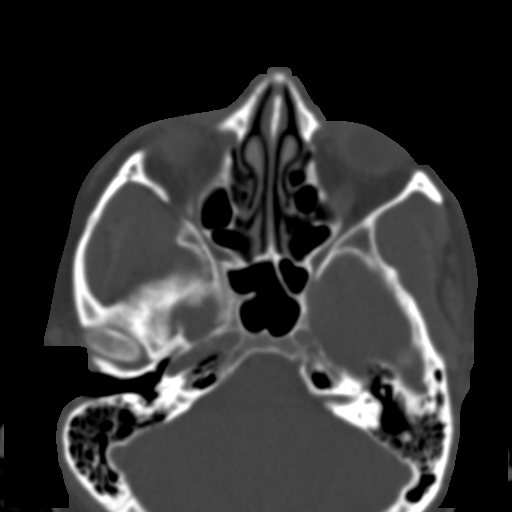
[im 60/80  brain]
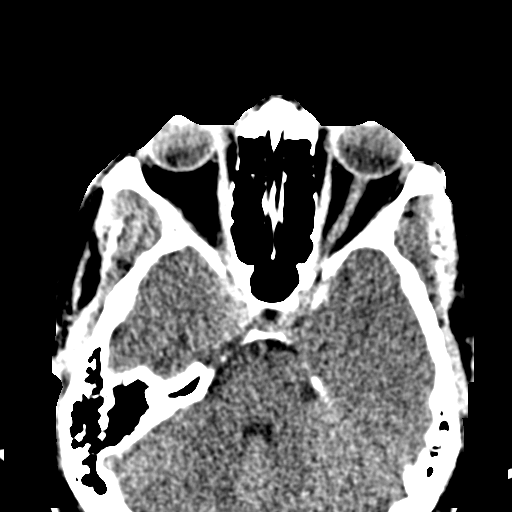
[im 60/80  bone]
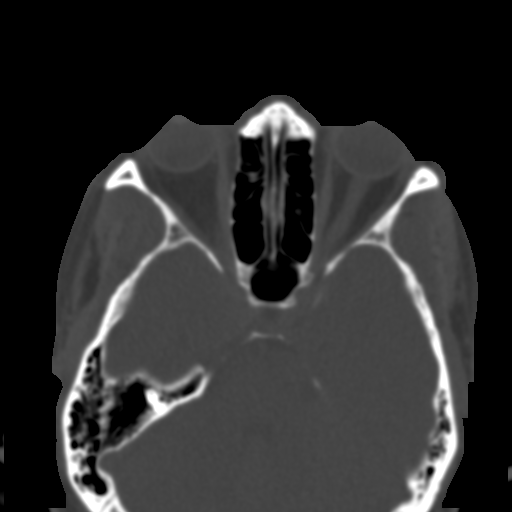
[im 69/80  bone]
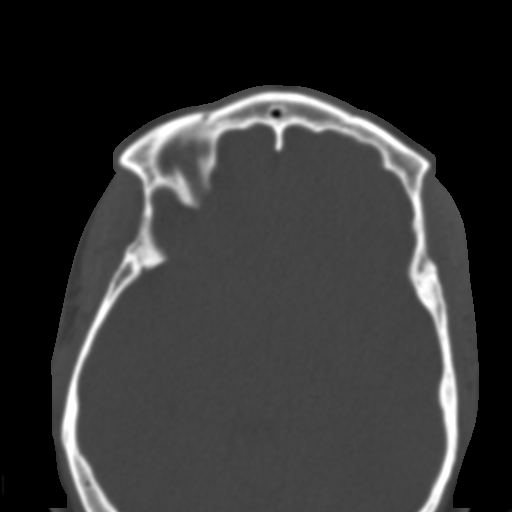
[im 74/80  bone]
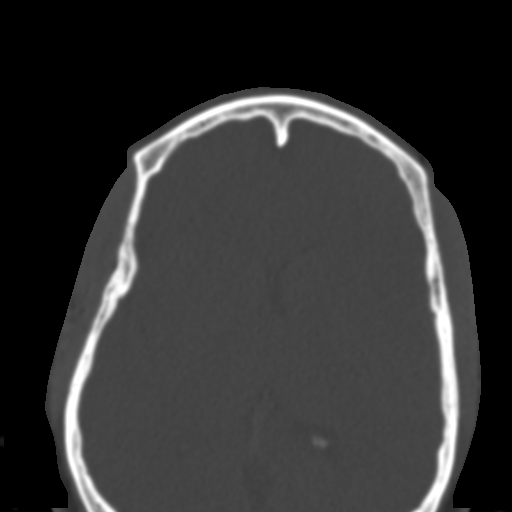

[Series 7: coronal soft tissue · coronal · 0.32mm/px · 3 of 76 slices shown]
[im 26/76  bone]
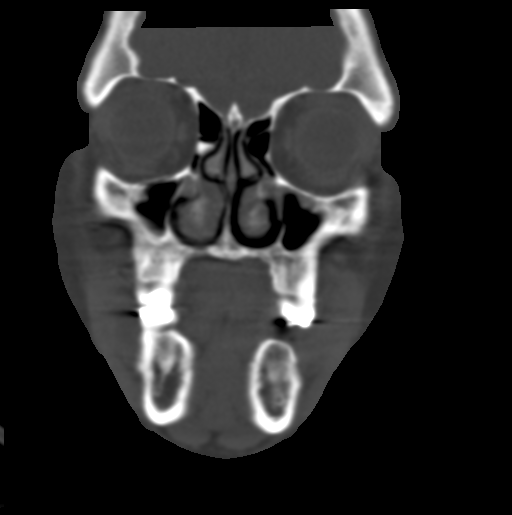
[im 34/76  bone]
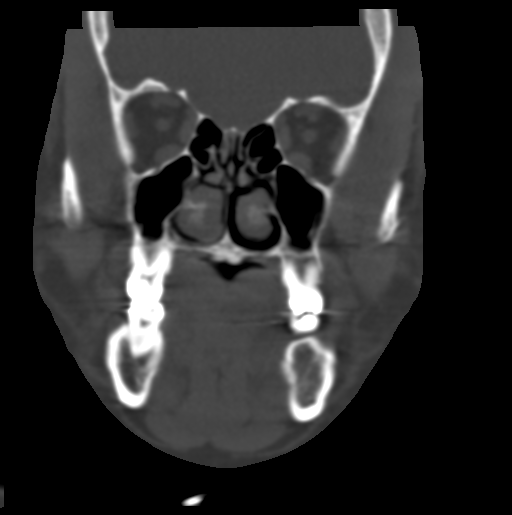
[im 42/76  bone]
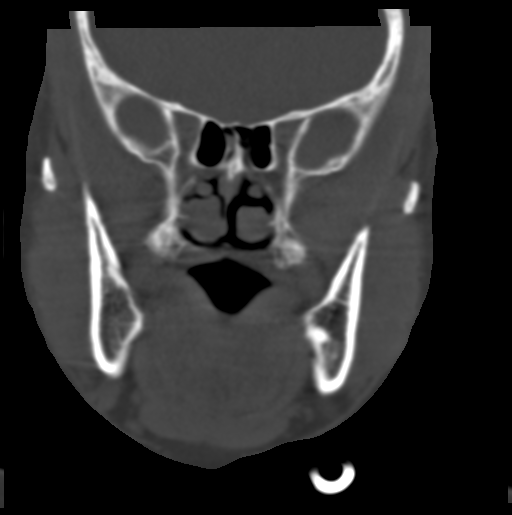

[Series 8: sagittal soft tissue · sagittal · 0.31mm/px · 3 of 76 slices shown]
[im 26/76  bone]
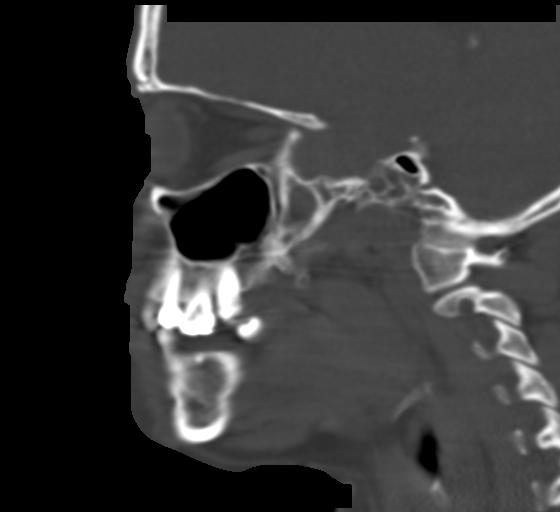
[im 38/76  bone]
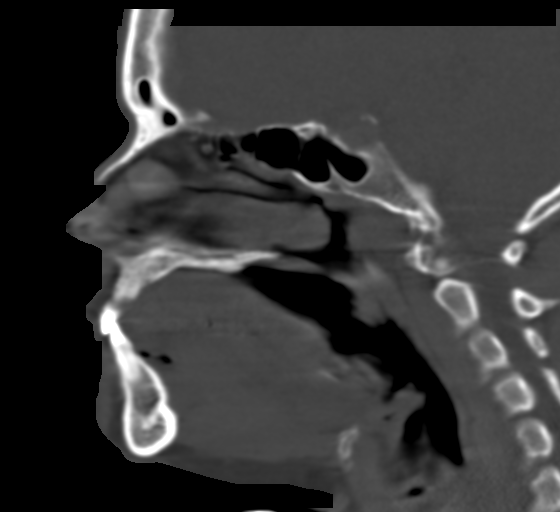
[im 51/76  bone]
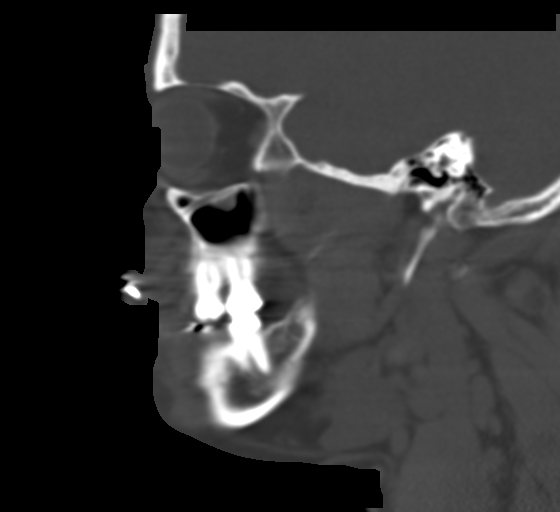

[17 of 47 positions shown; findings below may reference images not displayed]

FINDINGS: Osseous: No fracture or mandibular dislocation. No destructive
process.

Orbits: Negative. No traumatic or inflammatory finding.

Sinuses: Clear.

Soft tissues: Negative.

Limited intracranial: No significant or unexpected finding.
IMPRESSION: No definite abnormality seen.

## 2021-11-20 ENCOUNTER — Other Ambulatory Visit (HOSPITAL_COMMUNITY)
Admission: EM | Admit: 2021-11-20 | Discharge: 2021-11-26 | Disposition: A | Discharge status: Home / Self Care | Payer: Medicaid Other | Attending: Psychiatry | Admitting: Psychiatry

## 2021-11-20 DIAGNOSIS — Z1152 Encounter for screening for COVID-19: Secondary | ICD-10-CM | POA: Insufficient documentation

## 2021-11-20 DIAGNOSIS — F17210 Nicotine dependence, cigarettes, uncomplicated: Secondary | ICD-10-CM | POA: Insufficient documentation

## 2021-11-20 DIAGNOSIS — F1994 Other psychoactive substance use, unspecified with psychoactive substance-induced mood disorder: Secondary | ICD-10-CM | POA: Diagnosis not present

## 2021-11-20 DIAGNOSIS — F419 Anxiety disorder, unspecified: Secondary | ICD-10-CM | POA: Diagnosis not present

## 2021-11-20 DIAGNOSIS — Z79899 Other long term (current) drug therapy: Secondary | ICD-10-CM | POA: Insufficient documentation

## 2021-11-20 DIAGNOSIS — F32A Depression, unspecified: Secondary | ICD-10-CM | POA: Insufficient documentation

## 2021-11-20 LAB — URINALYSIS, COMPLETE (UACMP) WITH MICROSCOPIC
Bilirubin Urine: NEGATIVE
Glucose, UA: NEGATIVE mg/dL
Ketones, ur: NEGATIVE mg/dL
Nitrite: NEGATIVE
Protein, ur: NEGATIVE mg/dL
Specific Gravity, Urine: 1.018 (ref 1.005–1.030)
pH: 6 (ref 5.0–8.0)

## 2021-11-20 LAB — POC SARS CORONAVIRUS 2 AG -  ED: SARS Coronavirus 2 Ag: NEGATIVE

## 2021-11-20 LAB — COMPREHENSIVE METABOLIC PANEL
ALT: 21 U/L (ref 0–44)
AST: 19 U/L (ref 15–41)
Albumin: 3.3 g/dL — ABNORMAL LOW (ref 3.5–5.0)
Alkaline Phosphatase: 74 U/L (ref 38–126)
Anion gap: 9 (ref 5–15)
BUN: 9 mg/dL (ref 6–20)
CO2: 24 mmol/L (ref 22–32)
Calcium: 8.6 mg/dL — ABNORMAL LOW (ref 8.9–10.3)
Chloride: 107 mmol/L (ref 98–111)
Creatinine, Ser: 1.21 mg/dL — ABNORMAL HIGH (ref 0.44–1.00)
GFR, Estimated: 60 mL/min — ABNORMAL LOW (ref >60)
Glucose, Bld: 81 mg/dL (ref 70–99)
Potassium: 3.8 mmol/L (ref 3.5–5.1)
Sodium: 140 mmol/L (ref 135–145)
Total Bilirubin: 0.5 mg/dL (ref 0.3–1.2)
Total Protein: 6.3 g/dL — ABNORMAL LOW (ref 6.5–8.1)

## 2021-11-20 LAB — CBC WITH DIFFERENTIAL/PLATELET
Abs Immature Granulocytes: 0.01 10*3/uL (ref 0.00–0.07)
Basophils Absolute: 0 10*3/uL (ref 0.0–0.1)
Basophils Relative: 1 %
Eosinophils Absolute: 0.3 10*3/uL (ref 0.0–0.5)
Eosinophils Relative: 5 %
HCT: 41.8 % (ref 36.0–46.0)
Hemoglobin: 14 g/dL (ref 12.0–15.0)
Immature Granulocytes: 0 %
Lymphocytes Relative: 38 %
Lymphs Abs: 2.3 10*3/uL (ref 0.7–4.0)
MCH: 30.4 pg (ref 26.0–34.0)
MCHC: 33.5 g/dL (ref 30.0–36.0)
MCV: 90.7 fL (ref 80.0–100.0)
Monocytes Absolute: 0.5 10*3/uL (ref 0.1–1.0)
Monocytes Relative: 8 %
Neutro Abs: 2.9 10*3/uL (ref 1.7–7.7)
Neutrophils Relative %: 48 %
Platelets: 294 10*3/uL (ref 150–400)
RBC: 4.61 MIL/uL (ref 3.87–5.11)
RDW: 13.6 % (ref 11.5–15.5)
WBC: 6.1 10*3/uL (ref 4.0–10.5)
nRBC: 0 % (ref 0.0–0.2)

## 2021-11-20 LAB — POCT URINE DRUG SCREEN - MANUAL ENTRY (I-SCREEN)
POC Amphetamine UR: NOT DETECTED
POC Buprenorphine (BUP): NOT DETECTED
POC Cocaine UR: POSITIVE — AB
POC Marijuana UR: POSITIVE — AB
POC Methadone UR: NOT DETECTED
POC Methamphetamine UR: NOT DETECTED
POC Morphine: NOT DETECTED
POC Oxazepam (BZO): NOT DETECTED
POC Oxycodone UR: NOT DETECTED
POC Secobarbital (BAR): NOT DETECTED

## 2021-11-20 LAB — ETHANOL: Alcohol, Ethyl (B): <10 mg/dL (ref <10)

## 2021-11-20 LAB — RESP PANEL BY RT-PCR (FLU A&B, COVID) ARPGX2
Influenza A by PCR: NEGATIVE
Influenza B by PCR: NEGATIVE
SARS Coronavirus 2 by RT PCR: NEGATIVE

## 2021-11-20 LAB — LIPID PANEL
Cholesterol: 154 mg/dL (ref 0–200)
HDL: 54 mg/dL (ref >40)
LDL Cholesterol: 91 mg/dL (ref 0–99)
Total CHOL/HDL Ratio: 2.9 RATIO
Triglycerides: 46 mg/dL (ref <150)
VLDL: 9 mg/dL (ref 0–40)

## 2021-11-20 LAB — TSH: TSH: 0.515 u[IU]/mL (ref 0.350–4.500)

## 2021-11-20 LAB — MAGNESIUM: Magnesium: 2 mg/dL (ref 1.7–2.4)

## 2021-11-20 LAB — PREGNANCY, URINE: Preg Test, Ur: NEGATIVE

## 2021-11-20 LAB — HEMOGLOBIN A1C
Hgb A1c MFr Bld: 4.8 % (ref 4.8–5.6)
Mean Plasma Glucose: 91.06 mg/dL

## 2021-11-20 LAB — HIV ANTIBODY (ROUTINE TESTING W REFLEX): HIV Screen 4th Generation wRfx: NONREACTIVE

## 2021-11-20 MED ORDER — ADULT MULTIVITAMIN W/MINERALS CH
1.0000 | ORAL_TABLET | Freq: Every day | ORAL | Status: DC
  Administered 2021-11-20 – 2021-11-25 (×6): 1 via ORAL
  Filled 2021-11-20: qty 1
  Filled 2021-11-20: qty 14
  Filled 2021-11-20 (×6): qty 1

## 2021-11-20 MED ORDER — THIAMINE HCL 100 MG/ML IJ SOLN
100.0000 mg | Freq: Once | INTRAMUSCULAR | Status: AC
  Administered 2021-11-20: 100 mg via INTRAMUSCULAR
  Filled 2021-11-20: qty 2

## 2021-11-20 MED ORDER — LORAZEPAM 1 MG PO TABS: 1.0000 mg | ORAL_TABLET | Freq: Four times a day (QID) | ORAL | Status: AC | PRN

## 2021-11-20 MED ORDER — MAGNESIUM HYDROXIDE 400 MG/5ML PO SUSP
30.0000 mL | Freq: Every day | ORAL | Status: DC | PRN
  Administered 2021-11-23: 30 mL via ORAL
  Filled 2021-11-20: qty 30

## 2021-11-20 MED ORDER — TRAZODONE HCL 50 MG PO TABS
50.0000 mg | ORAL_TABLET | Freq: Every evening | ORAL | Status: DC | PRN
  Administered 2021-11-20 – 2021-11-25 (×5): 50 mg via ORAL
  Filled 2021-11-20: qty 1
  Filled 2021-11-20: qty 14
  Filled 2021-11-20 (×3): qty 1

## 2021-11-20 MED ORDER — LOPERAMIDE HCL 2 MG PO CAPS: 2.0000 mg | ORAL_CAPSULE | ORAL | Status: AC | PRN

## 2021-11-20 MED ORDER — THIAMINE MONONITRATE 100 MG PO TABS
100.0000 mg | ORAL_TABLET | Freq: Every day | ORAL | Status: DC
  Administered 2021-11-21 – 2021-11-25 (×5): 100 mg via ORAL
  Filled 2021-11-20 (×2): qty 1
  Filled 2021-11-20: qty 14
  Filled 2021-11-20 (×4): qty 1

## 2021-11-20 MED ORDER — ACETAMINOPHEN 325 MG PO TABS: 650.0000 mg | ORAL_TABLET | Freq: Four times a day (QID) | ORAL | Status: DC | PRN

## 2021-11-20 MED ORDER — ALUM & MAG HYDROXIDE-SIMETH 200-200-20 MG/5ML PO SUSP: 30.0000 mL | ORAL | Status: DC | PRN

## 2021-11-20 MED ORDER — ONDANSETRON 4 MG PO TBDP: 4.0000 mg | ORAL_TABLET | Freq: Four times a day (QID) | ORAL | Status: AC | PRN

## 2021-11-20 MED ORDER — TRAZODONE HCL 50 MG PO TABS
ORAL_TABLET | ORAL | Status: AC
  Filled 2021-11-20: qty 1

## 2021-11-20 MED ORDER — HYDROXYZINE HCL 25 MG PO TABS
25.0000 mg | ORAL_TABLET | Freq: Four times a day (QID) | ORAL | Status: AC | PRN
  Administered 2021-11-22: 25 mg via ORAL
  Filled 2021-11-20 (×2): qty 1

## 2021-11-20 NOTE — ED Notes (Signed)
Pt admitted to [facility] seeking detox from cocaine and alcohol. Pt denies SI, HI, AVH at present. Patient was cooperative during the admission assessment. Skin assessment complete. Belongings in the locker. Patient oriented to unit and unit rules. Meal and drinks offered to patient.  Patient verbalized agreement to treatment plans. Patient verbally contracts for safety while hospitalized. Will monitor for safety.
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
## 2021-11-20 NOTE — ED Notes (Signed)
Pending 2 Hr Covid result and transfer to [facility]. ?

## 2021-11-20 NOTE — ED Notes (Signed)
Patient observed/assessed in room in bed appearing in no immediate distress resting peacefully. Q15 minute checks continued by MHT and nursing staff. Will continue to monitor and support. 
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
## 2021-11-21 ENCOUNTER — Encounter (HOSPITAL_COMMUNITY): Payer: Self-pay

## 2021-11-21 DIAGNOSIS — F32A Depression, unspecified: Secondary | ICD-10-CM | POA: Diagnosis not present

## 2021-11-21 DIAGNOSIS — F419 Anxiety disorder, unspecified: Secondary | ICD-10-CM | POA: Diagnosis not present

## 2021-11-21 DIAGNOSIS — Z1152 Encounter for screening for COVID-19: Secondary | ICD-10-CM | POA: Diagnosis not present

## 2021-11-21 DIAGNOSIS — F1994 Other psychoactive substance use, unspecified with psychoactive substance-induced mood disorder: Secondary | ICD-10-CM | POA: Diagnosis not present

## 2021-11-21 LAB — RPR: RPR Ser Ql: NONREACTIVE

## 2021-11-21 MED ORDER — PRAZOSIN HCL 1 MG PO CAPS
1.0000 mg | ORAL_CAPSULE | Freq: Every day | ORAL | Status: DC
  Administered 2021-11-21: 1 mg via ORAL
  Filled 2021-11-21: qty 1

## 2021-11-21 MED ORDER — PANTOPRAZOLE SODIUM 20 MG PO TBEC
20.0000 mg | DELAYED_RELEASE_TABLET | Freq: Two times a day (BID) | ORAL | Status: DC
  Administered 2021-11-21 – 2021-11-26 (×10): 20 mg via ORAL
  Filled 2021-11-21 (×10): qty 1
  Filled 2021-11-21: qty 28

## 2021-11-21 MED ORDER — ALBUTEROL SULFATE HFA 108 (90 BASE) MCG/ACT IN AERS
1.0000 | INHALATION_SPRAY | Freq: Four times a day (QID) | RESPIRATORY_TRACT | Status: DC | PRN
  Filled 2021-11-21 (×2): qty 6.7

## 2021-11-21 NOTE — Progress Notes (Signed)
LCSW was advised by patient to speak with her family regarding the current plan at this time. Patient provided LCSW with the contact number for the brother Brette Cast [phone] and sister-in-law Burman Blacksmith [phone]. LCSW was able to speak to both family members together via telephone. Brother reports that his biggest concern is that patient will get bored with treatment and decide to leave. Brother reports he also is concerned that she will not be fully honest about what her needs are and current issues. Brother was provided update regarding LCSW conversation with patient on this morning that went well. Patient expressed motivation for change and reports a current interest in seeking treatment. Brother was informed that patient has been accepted to [facility] and will transfer on Monday morning. Brother reports concerns with the patient likely wanting to leave prior to discharge and asked if LCSW could hold the patient here until transfer. Brother made aware of [place] and informed brother that from my understanding, the patient is not under involuntary commitment so the patient could leave voluntarily if she chose to. Brief supportive counseling was provided to the brother regarding encouraging the patient and being supportive in her process, even after she transitions to Residential treatment. Family expressed understanding and appreciation for LCSW assistance and conversation. No other needs were reported at this time. Family aware that they may follow up with LCSW if any further issues may arise.   Lucius Conn, LCSW Clinical Social Worker [facility] Ph: [phone]

## 2021-11-21 NOTE — ED Notes (Signed)
Pt is in the bed sleeping. Respirations are even and unlabored. No acute distress noted. Will continue to monitor for safety. 

## 2021-11-21 NOTE — ED Notes (Signed)
Patient observed/assessed in room in bed appearing in no immediate distress resting peacefully. Q15 minute checks continued by MHT and nursing staff. Will continue to monitor and support. 

## 2021-11-21 NOTE — BH IP Treatment Plan (Signed)
Interdisciplinary Treatment and Diagnostic Plan Update  11/21/2021 Time of Session: 8:44AM Nakeeta Sebastiani MRN: [id number]  Diagnosis:  Final diagnoses:  Substance induced mood disorder (HCC)     Current Medications:  Current Facility-Administered Medications  Medication Dose Route Frequency Provider Last Rate Last Admin   acetaminophen (TYLENOL) tablet 650 mg  650 mg Oral Q6H PRN Ardis Hughs, NP       alum & mag hydroxide-simeth (MAALOX/MYLANTA) 200-200-20 MG/5ML suspension 30 mL  30 mL Oral Q4H PRN Ardis Hughs, NP       hydrOXYzine (ATARAX) tablet 25 mg  25 mg Oral Q6H PRN Ardis Hughs, NP       loperamide (IMODIUM) capsule 2-4 mg  2-4 mg Oral PRN Ardis Hughs, NP       LORazepam (ATIVAN) tablet 1 mg  1 mg Oral Q6H PRN Ardis Hughs, NP       magnesium hydroxide (MILK OF MAGNESIA) suspension 30 mL  30 mL Oral Daily PRN Ardis Hughs, NP       multivitamin with minerals tablet 1 tablet  1 tablet Oral Daily Ardis Hughs, NP   1 tablet at 11/20/21 2008   ondansetron (ZOFRAN-ODT) disintegrating tablet 4 mg  4 mg Oral Q6H PRN Ardis Hughs, NP       thiamine (VITAMIN B1) tablet 100 mg  100 mg Oral Daily Ardis Hughs, NP       traZODone (DESYREL) tablet 50 mg  50 mg Oral QHS PRN Onuoha, Chinwendu V, NP   50 mg at 11/20/21 2256   Current Outpatient Medications  Medication Sig Dispense Refill   albuterol (VENTOLIN HFA) 108 (90 Base) MCG/ACT inhaler Inhale 1-2 puffs into the lungs every 4 (four) hours as needed for wheezing. 1 each 0   omeprazole (PRILOSEC) 20 MG capsule Take 1 capsule (20 mg total) by mouth daily. 14 capsule 0   ondansetron (ZOFRAN) 4 MG tablet Take 1 tablet (4 mg total) by mouth every 6 (six) hours. 12 tablet 0   triamcinolone cream (KENALOG) 0.1 % Apply 1 application topically 2 (two) times daily. 30 g 0   PTA Medications: Prior to Admission medications   Medication Sig Start Date End Date Taking? Authorizing Provider   albuterol (VENTOLIN HFA) 108 (90 Base) MCG/ACT inhaler Inhale 1-2 puffs into the lungs every 4 (four) hours as needed for wheezing. 03/18/21   Achille Rich, PA-C  omeprazole (PRILOSEC) 20 MG capsule Take 1 capsule (20 mg total) by mouth daily. 03/18/21   Achille Rich, PA-C  ondansetron (ZOFRAN) 4 MG tablet Take 1 tablet (4 mg total) by mouth every 6 (six) hours. 03/18/21   Achille Rich, PA-C  triamcinolone cream (KENALOG) 0.1 % Apply 1 application topically 2 (two) times daily. 03/18/21   Achille Rich, PA-C    Patient Stressors: Loss of fiance 2 years ago   Substance abuse   Traumatic event    Patient Strengths: Ability for insight  Average or above average intelligence  Capable of independent living  Communication skills  General fund of knowledge  Motivation for treatment/growth  Supportive family/friends   Treatment Modalities: Medication Management, Group therapy, Case management,  1 to 1 session with clinician, Psychoeducation, Recreational therapy.   Physician Treatment Plan for Primary and Secondary Diagnosis:  Final diagnoses:  Substance induced mood disorder (HCC)   Long Term Goal(s): Improvement in symptoms so as ready for discharge  Short Term Goals: Patient will verbalize feelings in meetings with treatment team members.  Patient will attend at least of 50% of the groups daily. Pt will complete the PHQ9 on admission, day 3 and discharge. Patient will participate in completing the [place] Patient will score a low risk of violence for 24 hours prior to discharge Patient will take medications as prescribed daily.  Medication Management: Evaluate patient's response, side effects, and tolerance of medication regimen.  Therapeutic Interventions: 1 to 1 sessions, Unit Group sessions and Medication administration.  Evaluation of Outcomes: Progressing  LCSW Treatment Plan for Primary Diagnosis:  Final diagnoses:  Substance induced mood  disorder ([place])    Long Term Goal(s): Safe transition to appropriate next level of care at discharge.  Short Term Goals: Facilitate acceptance of mental health diagnosis and concerns through verbal commitment to aftercare plan and appointments at discharge., Patient will identify one social support prior to discharge to aid in patient's recovery., Patient will attend AA/NA groups as scheduled., Identify minimum of 2 triggers associated with mental health/substance abuse issues with treatment team members., and Increase skills for wellness and recovery by attending 50% of scheduled groups.  Therapeutic Interventions: Assess for all discharge needs, 1 to 1 time with Education officer, museum, Explore available resources and support systems, Assess for adequacy in community support network, Educate family and significant other(s) on suicide prevention, Complete Psychosocial Assessment, Interpersonal group therapy.  Evaluation of Outcomes: Progressing   Progress in Treatment: Attending groups: Yes. Participating in groups: Yes. Taking medication as prescribed: Yes. Toleration medication: Yes. Family/Significant other contact made: No, will contact:  Patient has not provided permission for collateral information to be gathered.  Patient understands diagnosis: Yes. Discussing patient identified problems/goals with staff: Yes. Medical problems stabilized or resolved: Yes. Denies suicidal/homicidal ideation: Yes. Issues/concerns per patient self-inventory: Yes. Other: Substance and alcohol use; loss of fiance about 2 years ago; Pt also reports stressors regarding her reading and writing skills. Reports she wants to get better at these two skills.   New problem(s) identified: No, Describe:  other stated issues on admission  New Short Term/Long Term Goal(s): Patient reports interest in Residential placement at this time. Patient reports she is also interested in receiving outpatient resources for therapy related  to grief.   Patient Goals:  Patient reports interest in Residential placement at this time. Patient reports she is also interested in receiving outpatient resources for therapy related to grief.   Discharge Plan or Barriers: LCSW will refer patient to [place] facility for review. LCSW will also provide patient with coloring activities as requested to keep herself busy.   Reason for Continuation of Hospitalization: Medication stabilization Other; describe substance abuse detox  Estimated Length of Stay: 3-5 days  Last 3 [place] Suicide Severity Risk Score: [place] ED from 11/20/2021 in [facility] from 03/18/2021 in [place] ED from 09/18/2020 in [place] No Risk No Risk No Risk       Last PHQ 2/9 Scores:    11/20/2021    6:35 PM  Depression screen PHQ 2/9  Decreased Interest 1  Down, Depressed, Hopeless 2  PHQ - 2 Score 3  Altered sleeping 2  Tired, decreased energy 2  Change in appetite 1  Feeling bad or failure about yourself  1  Trouble concentrating 1  Moving slowly or fidgety/restless 0  Suicidal thoughts 0  PHQ-9 Score 10  Difficult doing work/chores Somewhat difficult    Scribe for Treatment  Team: Loleta Dicker, LCSWA 11/21/2021 8:55 AM
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
## 2021-11-21 NOTE — ED Notes (Signed)
Patient is presently resting in bed without distress or complaint.  Patient is not in withdrawal.  Will monitor and provide safe environment.
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
## 2021-11-21 NOTE — Progress Notes (Signed)
Referral was received and per Sharyn Lull, patient has been accepted to their facilty and can transfer Monday 11/26/21 by 9:00am. Patient and MD to be made aware. Patient will need a 14-30 day supply of medication and one month refill. No nicotine gum allowed, however 14-30 day nicotine patches to be provided if needed. No other needs to report at this time.    LCSW will continue to follow up and provide updates as received.    Lucius Conn, LCSW Clinical Social Worker [facility] Ph: [phone]

## 2021-11-21 NOTE — [facility] Group Notes (Signed)
[facility] LCSW Group Therapy   Date/ Time: 11/21/2021 at 1:30PM  Type of Therapy:  Group Therapy  Participation Level:  Active  Participation Quality:  Appropriate  Affect:  Appropriate  Cognitive:  Appropriate  Insight:  Developing/Improving  Engagement in Therapy:  Developing/Improving  Modes of Intervention:  Activity, Discussion, Rapport Building, Socialization and Support  Summary of Progress/Problems: Patient actively participated in group on today. Group started off with introductions and group rules. Group members participated in a therapeutic activity that required active listening and communication skills. Group members were able to identify similarities and differences within the group. Patient reports she enjoyed the group and would recommend this group to other individuals in need of help. Patient interacted positively with staff and peers. No issues to report.   Lucius Conn, LCSW Clinical Social Worker [facility] Ph: [phone]

## 2021-11-22 DIAGNOSIS — F32A Depression, unspecified: Secondary | ICD-10-CM | POA: Diagnosis not present

## 2021-11-22 DIAGNOSIS — Z1152 Encounter for screening for COVID-19: Secondary | ICD-10-CM | POA: Diagnosis not present

## 2021-11-22 DIAGNOSIS — F1994 Other psychoactive substance use, unspecified with psychoactive substance-induced mood disorder: Secondary | ICD-10-CM | POA: Diagnosis not present

## 2021-11-22 DIAGNOSIS — F419 Anxiety disorder, unspecified: Secondary | ICD-10-CM | POA: Diagnosis not present

## 2021-11-22 MED ORDER — NICOTINE 21 MG/24HR TD PT24
21.0000 mg | MEDICATED_PATCH | Freq: Every day | TRANSDERMAL | Status: DC
  Administered 2021-11-22 – 2021-11-25 (×4): 21 mg via TRANSDERMAL
  Filled 2021-11-22: qty 14
  Filled 2021-11-22 (×5): qty 1

## 2021-11-22 NOTE — ED Notes (Signed)
Pt sleeping. No noted resp distress. Will continue to monitor for safety 

## 2021-11-22 NOTE — Progress Notes (Addendum)
Received Amanda Gray this AM in the dinning room eating breakfast. She was social with her peers. Later she requested medication for indigestion.She got upset when she was told "I will be with you as soon as possible." She called her mom and her mom calmed her down. Later she was able to talk with this writer,she apologized and this writer  explained to her I was not being rude. We talked in depth afterwards and she verbalized her stressors.

## 2021-11-22 NOTE — ED Notes (Signed)
Pt a/o x 4 . Watching tv with peers in dayroom. Denies SI/HI/AVH. Denies c/o withdrawal symptoms. Will continue to monitor for safety

## 2021-11-22 NOTE — ED Provider Notes (Addendum)
Behavioral Health Progress Note  Date and Time: 11/22/2021 6:25 PM Name: Amanda Gray MRN:  [id number]  Subjective:   Amanda Gray is a 36 year old female with no past psychiatric history who presented to the [facility] behavioral health urgent care requesting detox and placement in residential rehab, specifically [facility].  She reports drinking 7 beers per day, smoking marijuana several times per day, and using crack cocaine occasionally.  She denies ever experiencing alcohol withdrawal symptoms.  She reports significant depressive symptoms with a major stressor being the death of her fianc in 06-11-2018.  The patient has been accepted to [facility] for Jun 11, 2022.  The patient denies auditory/visual hallucinations and first rank symptoms.  The patient reports good mood, appetite, and sleep. They deny suicidal and homicidal thoughts. The patient denies side effects from their medications.  Review of systems as below. The patient denies experiencing any withdrawal symptoms.         Diagnosis:  Final diagnoses:  Substance induced mood disorder (HCC)    Total Time spent with patient: 20 minutes  Past Psychiatric History: as above Past Medical History:  Past Medical History:  Diagnosis Date   Asthma    Back pain    Seasonal allergies     Past Surgical History:  Procedure Laterality Date   CESAREAN SECTION     CESAREAN SECTION     Family History: No family history on file. Family Psychiatric  History: per H and P Social History:  Social History   Substance and Sexual Activity  Alcohol Use No     Social History   Substance and Sexual Activity  Drug Use No    Social History   Socioeconomic History   Marital status: Single    Spouse name: Not on file   Number of children: Not on file   Years of education: Not on file   Highest education level: Not on file  Occupational History   Not on file  Tobacco Use   Smoking status: Every Day    Packs/day: 1.00    Types: Cigarettes    Smokeless tobacco: Never  Substance and Sexual Activity   Alcohol use: No   Drug use: No   Sexual activity: Not on file  Other Topics Concern   Not on file  Social History Narrative   Not on file   Social Determinants of Health   Financial Resource Strain: Not on file  Food Insecurity: Not on file  Transportation Needs: Not on file  Physical Activity: Not on file  Stress: Not on file  Social Connections: Not on file   SDOH:  SDOH Screenings   Depression (PHQ2-9): High Risk (11/21/2021)  Tobacco Use: High Risk (03/18/2021)   Additional Social History:    Pain Medications: See MAR Prescriptions: See MAR Over the Counter: See MAR History of alcohol / drug use?: Yes Longest period of sobriety (when/how long): several months Negative Consequences of Use: Personal relationships Withdrawal Symptoms: None Name of Substance 1: ETOH 1 - Age of First Use: 20s 1 - Amount (size/oz): 6-24 oz beers 1 - Frequency: daily 1 - Duration: 1 month 1 - Last Use / Amount: last night - 1- 12 oz beer 1 - Method of Aquiring: NA 1- Route of Use: NA Name of Substance 2: Cocaine 2 - Age of First Use: 20s 2 - Amount (size/oz): varies 2 - Frequency: occasional 2 - Duration: NA 2 - Last Use / Amount: 3 days ago - $100 worth 2 - Method of Aquiring: NA  2 - Route of Substance Use: smokes                Sleep: Fair  Appetite:  Fair  Current Medications:  Current Facility-Administered Medications  Medication Dose Route Frequency Provider Last Rate Last Admin   acetaminophen (TYLENOL) tablet 650 mg  650 mg Oral Q6H PRN Ardis Hughs, NP       albuterol (VENTOLIN HFA) 108 (90 Base) MCG/ACT inhaler 1 puff  1 puff Inhalation Q6H PRN Carlyn Reichert, MD       alum & mag hydroxide-simeth (MAALOX/MYLANTA) 200-200-20 MG/5ML suspension 30 mL  30 mL Oral Q4H PRN Ardis Hughs, NP       hydrOXYzine (ATARAX) tablet 25 mg  25 mg Oral Q6H PRN Ardis Hughs, NP       loperamide  (IMODIUM) capsule 2-4 mg  2-4 mg Oral PRN Ardis Hughs, NP       LORazepam (ATIVAN) tablet 1 mg  1 mg Oral Q6H PRN Ardis Hughs, NP       magnesium hydroxide (MILK OF MAGNESIA) suspension 30 mL  30 mL Oral Daily PRN Ardis Hughs, NP       multivitamin with minerals tablet 1 tablet  1 tablet Oral Daily Ardis Hughs, NP   1 tablet at 11/22/21 0934   nicotine (NICODERM CQ - dosed in mg/24 hours) patch 21 mg  21 mg Transdermal Daily Lauree Chandler, NP   21 mg at 11/22/21 1714   ondansetron (ZOFRAN-ODT) disintegrating tablet 4 mg  4 mg Oral Q6H PRN Ardis Hughs, NP       pantoprazole (PROTONIX) EC tablet 20 mg  20 mg Oral BID AC Carlyn Reichert, MD   20 mg at 11/22/21 1714   prazosin (MINIPRESS) capsule 1 mg  1 mg Oral QHS Carlyn Reichert, MD   1 mg at 11/21/21 2126   thiamine (VITAMIN B1) tablet 100 mg  100 mg Oral Daily Ardis Hughs, NP   100 mg at 11/22/21 0934   traZODone (DESYREL) tablet 50 mg  50 mg Oral QHS PRN Onuoha, Chinwendu V, NP   50 mg at 11/20/21 2256   Current Outpatient Medications  Medication Sig Dispense Refill   albuterol (VENTOLIN HFA) 108 (90 Base) MCG/ACT inhaler Inhale 1-2 puffs into the lungs every 4 (four) hours as needed for wheezing. 1 each 0   omeprazole (PRILOSEC) 20 MG capsule Take 1 capsule (20 mg total) by mouth daily. (Patient taking differently: Take 20 mg by mouth daily as needed (For heartburn or acid reflux).) 14 capsule 0    Labs  Lab Results:  Admission on 11/20/2021  Component Date Value Ref Range Status   SARS Coronavirus 2 by RT PCR 11/20/2021 NEGATIVE  NEGATIVE Final   Comment: (NOTE) SARS-CoV-2 target nucleic acids are NOT DETECTED.  The SARS-CoV-2 RNA is generally detectable in upper respiratory specimens during the acute phase of infection. The lowest concentration of SARS-CoV-2 viral copies this assay can detect is 138 copies/mL. A negative result does not preclude SARS-Cov-2 infection and should not be  used as the sole basis for treatment or other patient management decisions. A negative result may occur with  improper specimen collection/handling, submission of specimen other than nasopharyngeal swab, presence of viral mutation(s) within the areas targeted by this assay, and inadequate number of viral copies(<138 copies/mL). A negative result must be combined with clinical observations, patient history, and epidemiological information. The expected result is Negative.  Fact Sheet  for Patients:  BloggerCourse.com  Fact Sheet for Healthcare Providers:  SeriousBroker.it  This test is no                          t yet approved or cleared by the [place] FDA and  has been authorized for detection and/or diagnosis of SARS-CoV-2 by FDA under an Emergency Use Authorization (EUA). This EUA will remain  in effect (meaning this test can be used) for the duration of the COVID-19 declaration under Section 564(b)(1) of the Act, 21 U.S.C.section 360bbb-3(b)(1), unless the authorization is terminated  or revoked sooner.       Influenza A by PCR 11/20/2021 NEGATIVE  NEGATIVE Final   Influenza B by PCR 11/20/2021 NEGATIVE  NEGATIVE Final   Comment: (NOTE) The Xpert Xpress SARS-CoV-2/FLU/RSV plus assay is intended as an aid in the diagnosis of influenza from Nasopharyngeal swab specimens and should not be used as a sole basis for treatment. Nasal washings and aspirates are unacceptable for Xpert Xpress SARS-CoV-2/FLU/RSV testing.  Fact Sheet for Patients: BloggerCourse.com  Fact Sheet for Healthcare Providers: SeriousBroker.it  This test is not yet approved or cleared by the [place] FDA and has been authorized for detection and/or diagnosis of SARS-CoV-2 by FDA under an Emergency Use Authorization (EUA). This EUA will remain in effect (meaning this test can be used) for the  duration of the COVID-19 declaration under Section 564(b)(1) of the Act, 21 U.S.C. section 360bbb-3(b)(1), unless the authorization is terminated or revoked.  Performed at [facility], [address], [place], [place] [address]    WBC 11/20/2021 6.1  4.0 - 10.5 K/uL Final   RBC 11/20/2021 4.61  3.87 - 5.11 MIL/uL Final   Hemoglobin 11/20/2021 14.0  12.0 - 15.0 g/dL Final   HCT 74/25/9563 41.8  36.0 - 46.0 % Final   MCV 11/20/2021 90.7  80.0 - 100.0 fL Final   MCH 11/20/2021 30.4  26.0 - 34.0 pg Final   MCHC 11/20/2021 33.5  30.0 - 36.0 g/dL Final   RDW 87/56/4332 13.6  11.5 - 15.5 % Final   Platelets 11/20/2021 294  150 - 400 K/uL Final   nRBC 11/20/2021 0.0  0.0 - 0.2 % Final   Neutrophils Relative % 11/20/2021 48  % Final   Neutro Abs 11/20/2021 2.9  1.7 - 7.7 K/uL Final   Lymphocytes Relative 11/20/2021 38  % Final   Lymphs Abs 11/20/2021 2.3  0.7 - 4.0 K/uL Final   Monocytes Relative 11/20/2021 8  % Final   Monocytes Absolute 11/20/2021 0.5  0.1 - 1.0 K/uL Final   Eosinophils Relative 11/20/2021 5  % Final   Eosinophils Absolute 11/20/2021 0.3  0.0 - 0.5 K/uL Final   Basophils Relative 11/20/2021 1  % Final   Basophils Absolute 11/20/2021 0.0  0.0 - 0.1 K/uL Final   Immature Granulocytes 11/20/2021 0  % Final   Abs Immature Granulocytes 11/20/2021 0.01  0.00 - 0.07 K/uL Final   Performed at [facility], [address] [place], [place] [address]   Sodium 11/20/2021 140  135 - 145 mmol/L Final   Potassium 11/20/2021 3.8  3.5 - 5.1 mmol/L Final   Chloride 11/20/2021 107  98 - 111 mmol/L Final   CO2 11/20/2021 24  22 - 32 mmol/L Final   Glucose, Bld 11/20/2021 81  70 - 99 mg/dL Final   Glucose reference range applies only to samples taken after fasting for  at least 8 hours.   BUN 11/20/2021 9  6 - 20 mg/dL Final   Creatinine, Ser 11/20/2021 1.21 (H)  0.44 - 1.00 mg/dL Final   Calcium 62/95/2841 8.6 (L)  8.9 - 10.3 mg/dL Final   Total Protein 32/44/0102 6.3 (L)   6.5 - 8.1 g/dL Final   Albumin 72/53/6644 3.3 (L)  3.5 - 5.0 g/dL Final   AST 03/47/4259 19  15 - 41 U/L Final   ALT 11/20/2021 21  0 - 44 U/L Final   Alkaline Phosphatase 11/20/2021 74  38 - 126 U/L Final   Total Bilirubin 11/20/2021 0.5  0.3 - 1.2 mg/dL Final   GFR, Estimated 11/20/2021 60 (L)  >60 mL/min Final   Comment: (NOTE) Calculated using the CKD-EPI Creatinine Equation (2021)    Anion gap 11/20/2021 9  5 - 15 Final   Performed at [facility], [address], [place], [place] [address]   Hgb A1c MFr Bld 11/20/2021 4.8  4.8 - 5.6 % Final   Comment: (NOTE) Pre diabetes:          5.7%-6.4%  Diabetes:              >6.4%  Glycemic control for   <7.0% adults with diabetes    Mean Plasma Glucose 11/20/2021 91.06  mg/dL Final   Performed at [facility], [address], [place], [place] [address]   Magnesium 11/20/2021 2.0  1.7 - 2.4 mg/dL Final   Performed at [facility], [address], [place], [place] [address]   Alcohol, Ethyl (B) 11/20/2021 <10  <10 mg/dL Final   Comment: (NOTE) Lowest detectable limit for serum alcohol is 10 mg/dL.  For medical purposes only. Performed at [facility], [address], [place], [place] [address]    Cholesterol 11/20/2021 154  0 - 200 mg/dL Final   Triglycerides 07/28/1599 46  <150 mg/dL Final   HDL 09/32/3557 54  >40 mg/dL Final   Total CHOL/HDL Ratio 11/20/2021 2.9  RATIO Final   VLDL 11/20/2021 9  0 - 40 mg/dL Final   LDL Cholesterol 11/20/2021 91  0 - 99 mg/dL Final   Comment:        Total Cholesterol/HDL:CHD Risk Coronary Heart Disease Risk Table                     Men   Women  1/2 Average Risk   3.4   3.3  Average Risk       5.0   4.4  2 X Average Risk   9.6   7.1  3 X Average Risk  23.4   11.0        Use the calculated Patient Ratio above and the CHD Risk Table to determine the patient's CHD Risk.        ATP III CLASSIFICATION (LDL):  <100     mg/dL   Optimal  322-025  mg/dL   Near or  Above                    Optimal  130-159  mg/dL   Borderline  427-062  mg/dL   High  >376     mg/dL   Very High Performed at [facility], [address], [place], [place] [address]    TSH 11/20/2021 0.515  0.350 - 4.500 uIU/mL Final   Comment: Performed by a 3rd Generation assay with a functional sensitivity of <=0.01  uIU/mL. Performed at [facility], [place] [address], [place], [place] [address]    RPR Ser Ql 11/20/2021 NON REACTIVE  NON REACTIVE Final   Performed at [facility], [place] [address], [place], [place] [address]   Preg Test, Ur 11/20/2021 NEGATIVE  NEGATIVE Final   Comment:        THE SENSITIVITY OF THIS METHODOLOGY IS >20 mIU/mL. Performed at [facility], [place] [address] [place], [place] [address]    POC Amphetamine UR 11/20/2021 None Detected  NONE DETECTED (Cut Off Level 1000 ng/mL) Final   POC Secobarbital (BAR) 11/20/2021 None Detected  NONE DETECTED (Cut Off Level 300 ng/mL) Final   POC Buprenorphine (BUP) 11/20/2021 None Detected  NONE DETECTED (Cut Off Level 10 ng/mL) Final   POC Oxazepam (BZO) 11/20/2021 None Detected  NONE DETECTED (Cut Off Level 300 ng/mL) Final   POC Cocaine UR 11/20/2021 Positive (A)  NONE DETECTED (Cut Off Level 300 ng/mL) Final   POC Methamphetamine UR 11/20/2021 None Detected  NONE DETECTED (Cut Off Level 1000 ng/mL) Final   POC Morphine 11/20/2021 None Detected  NONE DETECTED (Cut Off Level 300 ng/mL) Final   POC Methadone UR 11/20/2021 None Detected  NONE DETECTED (Cut Off Level 300 ng/mL) Final   POC Oxycodone UR 11/20/2021 None Detected  NONE DETECTED (Cut Off Level 100 ng/mL) Final   POC Marijuana UR 11/20/2021 Positive (A)  NONE DETECTED (Cut Off Level 50 ng/mL) Final   Color, Urine 11/20/2021 YELLOW  YELLOW Final   APPearance 11/20/2021 HAZY (A)  CLEAR Final   Specific Gravity, Urine 11/20/2021 1.018  1.005 - 1.030 Final   pH 11/20/2021 6.0  5.0 - 8.0 Final   Glucose, UA 11/20/2021 NEGATIVE  NEGATIVE mg/dL  Final   Hgb urine dipstick 11/20/2021 SMALL (A)  NEGATIVE Final   Bilirubin Urine 11/20/2021 NEGATIVE  NEGATIVE Final   Ketones, ur 11/20/2021 NEGATIVE  NEGATIVE mg/dL Final   Protein, ur 11/20/2021 NEGATIVE  NEGATIVE mg/dL Final   Nitrite 11/20/2021 NEGATIVE  NEGATIVE Final   Leukocytes,Ua 11/20/2021 SMALL (A)  NEGATIVE Final   RBC / HPF 11/20/2021 6-10  0 - 5 RBC/hpf Final   WBC, UA 11/20/2021 11-20  0 - 5 WBC/hpf Final   Bacteria, UA 11/20/2021 FEW (A)  NONE SEEN Final   Squamous Epithelial / LPF 11/20/2021 0-5  0 - 5 Final   Mucus 11/20/2021 PRESENT   Final   Performed at [facility], [place] [address], [place], [place] [address]   HIV Screen 4th Generation wRfx 11/20/2021 Non Reactive  Non Reactive Final   Performed at [facility], [place] [address], [place], [place] [address]   SARS Coronavirus 2 Ag 11/20/2021 Negative  Negative Final    Blood Alcohol level:  Lab Results  Component Value Date   ETH <10 11/20/2021   ETH <10 00/86/7619    Metabolic Disorder Labs: Lab Results  Component Value Date   HGBA1C 4.8 11/20/2021   MPG 91.06 11/20/2021   No results found for: "PROLACTIN" Lab Results  Component Value Date   CHOL 154 11/20/2021   TRIG 46 11/20/2021   HDL 54 11/20/2021   CHOLHDL 2.9 11/20/2021   VLDL 9 11/20/2021   LDLCALC 91 11/20/2021    Therapeutic Lab Levels: No results found for: "LITHIUM" No results found for: "VALPROATE" No results found for: "CBMZ"  Physical Findings   PHQ2-9    [place] [facility]20/2021 in [facility]  PHQ-2 Total Score 4  PHQ-9 Total Score 20      Flowsheet Row ED [facility]  C-SSRS RISK CATEGORY No Risk No Risk No Risk        Musculoskeletal  Strength & Muscle Tone: within normal  limits Gait & Station: normal Patient leans: N/A  Psychiatric Specialty Exam  Presentation  General Appearance:  Casual  Eye Contact: Fair  Speech: Clear and Coherent  Speech Volume: normal  Mood and Affect  Mood: Euthymic  Affect: Appropriate; Congruent   Thought Process  Thought Processes: Coherent; Goal Directed  Descriptions of Associations:Intact  Orientation:Full (Time, Place and Person)  Thought Content:Logical; WDL  Diagnosis of Schizophrenia or Schizoaffective disorder in past: No    Hallucinations:none Ideas of Reference:None  Suicidal Thoughts: none Homicidal Thoughts: none  Sensorium  Memory: Immediate Fair; Recent Fair  Judgment: Fair  Insight: fair   Executive Functions  Concentration: Fair  Attention Span: Fair  Recall: FiservFair  Fund of Knowledge: Fair  Language: Fair   Psychomotor Activity  Psychomotor Activity: normal  Assets  Assets: Desire for Improvement; Resilience   Sleep  Sleep: fair    No data recorded   Physical Exam  Physical Exam Constitutional:      Appearance: the patient is not toxic-appearing.  Pulmonary:     Effort: Pulmonary effort is normal.  Neurological:     General: No focal deficit present.     Mental Status: the patient is alert and oriented to person, place, and time.   Review of Systems  Respiratory:  Negative for shortness of breath.   Cardiovascular:  Negative for chest pain.  Gastrointestinal:  Negative for abdominal pain, constipation, diarrhea, nausea and vomiting.  Neurological:  Negative for headaches.   Blood pressure (!) 159/87, pulse 77, temperature 98.5 F (36.9 C), temperature source Oral, resp. rate 18, SpO2 100 %. There is no height or weight on file to calculate BMI.  Treatment Plan Summary: Daily contact with patient to assess and evaluate symptoms and progress in treatment and Medication management  Status: Voluntary  Major depressive disorder, PTSD -  Patient declines antidepressant medicine at this time - Patient amenable to starting prazosin for nightmares and intrusive symptoms  Alcohol use disorder - CIWA with as needed Ativan - No taper given lack of history of withdrawal symptoms and no present withdrawal symptoms  HTN -Starting BP med tomorrow, patient wants to discuss with [facility]  Labs: Creatinine of 1.2, likely prerenal; other labs unremarkable, urine pregnancy test negative, GC/chlamydia probe pending  [facility]: [facility]  Carlyn ReichertNick Jadarian Mckay, MD 11/22/2021 6:25 PM

## 2021-11-22 NOTE — Progress Notes (Signed)
Amanda Gray napped throughout the day and made several calls.

## 2021-11-22 NOTE — ED Notes (Signed)
Patient attended group meeting in courtyard we talked about a variety of subjects, including, dealing with their addictions, taking responsibility for their actions, what are their plans when they leave here, working on mending old wounds and maybe trying to heal and make things right with their loved ones. 

## 2021-11-22 NOTE — ED Notes (Signed)
Pt. Is attending AA. ?

## 2021-11-23 DIAGNOSIS — F32A Depression, unspecified: Secondary | ICD-10-CM | POA: Diagnosis not present

## 2021-11-23 DIAGNOSIS — Z1152 Encounter for screening for COVID-19: Secondary | ICD-10-CM | POA: Diagnosis not present

## 2021-11-23 DIAGNOSIS — F419 Anxiety disorder, unspecified: Secondary | ICD-10-CM | POA: Diagnosis not present

## 2021-11-23 DIAGNOSIS — F1994 Other psychoactive substance use, unspecified with psychoactive substance-induced mood disorder: Secondary | ICD-10-CM | POA: Diagnosis not present

## 2021-11-23 MED ORDER — LOSARTAN POTASSIUM 50 MG PO TABS
50.0000 mg | ORAL_TABLET | Freq: Once | ORAL | Status: AC
  Administered 2021-11-23: 50 mg via ORAL
  Filled 2021-11-23: qty 1

## 2021-11-23 NOTE — Progress Notes (Signed)
Amanda Gray attended the nursing group session. She completed her safety plan, a copy was placed at her beside and in the chart. She expressed her feeling related to her stressors and substance abuse.This Probation officer will give her literature on grief and group sessions in the area.

## 2021-11-23 NOTE — ED Notes (Signed)
Pt awake alert / oriented x4. In dayroom watching  tv. Denies SI/HI/AVH. C/o abd bloating and constipation. Requests laxative. No noted resp distress. Will continue to monitor for safety

## 2021-11-23 NOTE — Progress Notes (Signed)
Amanda Gray  napped between groups and at intervals throughout the day. She made phone calls at intervals and have been  in a good mood. She has been compliant with her medications and looking forward to  going to [facility] on Monday.

## 2021-11-23 NOTE — ED Notes (Signed)
Pt sleeping. No noted resp distress. Will continue to monitor for safety 

## 2021-11-23 NOTE — ED Provider Notes (Signed)
Behavioral Health Progress Note  Date and Time: 11/23/2021 12:46 PM Name: Amanda Gray MRN:  [id number]  Subjective:   Amanda Gray is a 36 year old female with no past psychiatric history who presented to the [facility] behavioral health urgent care requesting detox and placement in residential rehab, specifically [facility].  She reports drinking 7 beers per day, smoking marijuana several times per day, and using crack cocaine occasionally.  She denies ever experiencing alcohol withdrawal symptoms.  She reports significant depressive symptoms with a major stressor being the death of her fianc in May 24, 2018.  The patient has been accepted to [facility] for 05-24-22.  The patient denies auditory/visual hallucinations and first rank symptoms.  The patient reports good mood, appetite, and sleep. They deny suicidal and homicidal thoughts. The patient denies side effects from their medications.  Review of systems as below. The patient denies experiencing any withdrawal symptoms.   Patient amenable to starting BP medicine.    Diagnosis:  Final diagnoses:  Substance induced mood disorder (HCC)    Total Time spent with patient: 20 minutes  Past Psychiatric History: as above Past Medical History:  Past Medical History:  Diagnosis Date   Asthma    Back pain    Seasonal allergies     Past Surgical History:  Procedure Laterality Date   CESAREAN SECTION     CESAREAN SECTION     Family History: No family history on file. Family Psychiatric  History: per H and P Social History:  Social History   Substance and Sexual Activity  Alcohol Use No     Social History   Substance and Sexual Activity  Drug Use No    Social History   Socioeconomic History   Marital status: Single    Spouse name: Not on file   Number of children: Not on file   Years of education: Not on file   Highest education level: Not on file  Occupational History   Not on file  Tobacco Use   Smoking status: Every Day     Packs/day: 1.00    Types: Cigarettes   Smokeless tobacco: Never  Substance and Sexual Activity   Alcohol use: No   Drug use: No   Sexual activity: Not on file  Other Topics Concern   Not on file  Social History Narrative   Not on file   Social Determinants of Health   Financial Resource Strain: Not on file  Food Insecurity: Not on file  Transportation Needs: Not on file  Physical Activity: Not on file  Stress: Not on file  Social Connections: Not on file   SDOH:  SDOH Screenings   Depression (PHQ2-9): High Risk (11/21/2021)  Tobacco Use: High Risk (03/18/2021)   Additional Social History:    Pain Medications: See MAR Prescriptions: See MAR Over the Counter: See MAR History of alcohol / drug use?: Yes Longest period of sobriety (when/how long): several months Negative Consequences of Use: Personal relationships Withdrawal Symptoms: None Name of Substance 1: ETOH 1 - Age of First Use: 20s 1 - Amount (size/oz): 6-24 oz beers 1 - Frequency: daily 1 - Duration: 1 month 1 - Last Use / Amount: last night - 1- 12 oz beer 1 - Method of Aquiring: NA 1- Route of Use: NA Name of Substance 2: Cocaine 2 - Age of First Use: 20s 2 - Amount (size/oz): varies 2 - Frequency: occasional 2 - Duration: NA 2 - Last Use / Amount: 3 days ago - $100 worth 2 - Method  of Aquiring: NA 2 - Route of Substance Use: smokes                Sleep: Fair  Appetite:  Fair  Current Medications:  Current Facility-Administered Medications  Medication Dose Route Frequency Provider Last Rate Last Admin   acetaminophen (TYLENOL) tablet 650 mg  650 mg Oral Q6H PRN Ardis Hughs, NP       albuterol (VENTOLIN HFA) 108 (90 Base) MCG/ACT inhaler 1 puff  1 puff Inhalation Q6H PRN Carlyn Reichert, MD       alum & mag hydroxide-simeth (MAALOX/MYLANTA) 200-200-20 MG/5ML suspension 30 mL  30 mL Oral Q4H PRN Ardis Hughs, NP       hydrOXYzine (ATARAX) tablet 25 mg  25 mg Oral Q6H PRN Ardis Hughs, NP   25 mg at 11/22/21 2134   loperamide (IMODIUM) capsule 2-4 mg  2-4 mg Oral PRN Ardis Hughs, NP       LORazepam (ATIVAN) tablet 1 mg  1 mg Oral Q6H PRN Ardis Hughs, NP       losartan (COZAAR) tablet 50 mg  50 mg Oral Once Carlyn Reichert, MD       magnesium hydroxide (MILK OF MAGNESIA) suspension 30 mL  30 mL Oral Daily PRN Ardis Hughs, NP       multivitamin with minerals tablet 1 tablet  1 tablet Oral Daily Ardis Hughs, NP   1 tablet at 11/23/21 0845   nicotine (NICODERM CQ - dosed in mg/24 hours) patch 21 mg  21 mg Transdermal Daily Lauree Chandler, NP   21 mg at 11/23/21 0839   ondansetron (ZOFRAN-ODT) disintegrating tablet 4 mg  4 mg Oral Q6H PRN Ardis Hughs, NP       pantoprazole (PROTONIX) EC tablet 20 mg  20 mg Oral BID AC Carlyn Reichert, MD   20 mg at 11/23/21 4098   thiamine (VITAMIN B1) tablet 100 mg  100 mg Oral Daily Ardis Hughs, NP   100 mg at 11/23/21 0839   traZODone (DESYREL) tablet 50 mg  50 mg Oral QHS PRN Onuoha, Chinwendu V, NP   50 mg at 11/22/21 2135   Current Outpatient Medications  Medication Sig Dispense Refill   albuterol (VENTOLIN HFA) 108 (90 Base) MCG/ACT inhaler Inhale 1-2 puffs into the lungs every 4 (four) hours as needed for wheezing. 1 each 0   omeprazole (PRILOSEC) 20 MG capsule Take 1 capsule (20 mg total) by mouth daily. (Patient taking differently: Take 20 mg by mouth daily as needed (For heartburn or acid reflux).) 14 capsule 0    Labs  Lab Results:  Admission on 11/20/2021  Component Date Value Ref Range Status   SARS Coronavirus 2 by RT PCR 11/20/2021 NEGATIVE  NEGATIVE Final   Comment: (NOTE) SARS-CoV-2 target nucleic acids are NOT DETECTED.  The SARS-CoV-2 RNA is generally detectable in upper respiratory specimens during the acute phase of infection. The lowest concentration of SARS-CoV-2 viral copies this assay can detect is 138 copies/mL. A negative result does not preclude  SARS-Cov-2 infection and should not be used as the sole basis for treatment or other patient management decisions. A negative result may occur with  improper specimen collection/handling, submission of specimen other than nasopharyngeal swab, presence of viral mutation(s) within the areas targeted by this assay, and inadequate number of viral copies(<138 copies/mL). A negative result must be combined with clinical observations, patient history, and epidemiological information. The expected result is Negative.  Fact Sheet for Patients:  EntrepreneurPulse.com.au  Fact Sheet for Healthcare Providers:  IncredibleEmployment.be  This test is no                          t yet approved or cleared by the [place] FDA and  has been authorized for detection and/or diagnosis of SARS-CoV-2 by FDA under an Emergency Use Authorization (EUA). This EUA will remain  in effect (meaning this test can be used) for the duration of the COVID-19 declaration under Section 564(b)(1) of the Act, 21 U.S.C.section 360bbb-3(b)(1), unless the authorization is terminated  or revoked sooner.       Influenza A by PCR 11/20/2021 NEGATIVE  NEGATIVE Final   Influenza B by PCR 11/20/2021 NEGATIVE  NEGATIVE Final   Comment: (NOTE) The Xpert Xpress SARS-CoV-2/FLU/RSV plus assay is intended as an aid in the diagnosis of influenza from Nasopharyngeal swab specimens and should not be used as a sole basis for treatment. Nasal washings and aspirates are unacceptable for Xpert Xpress SARS-CoV-2/FLU/RSV testing.  Fact Sheet for Patients: EntrepreneurPulse.com.au  Fact Sheet for Healthcare Providers: IncredibleEmployment.be  This test is not yet approved or cleared by the [place] FDA and has been authorized for detection and/or diagnosis of SARS-CoV-2 by FDA under an Emergency Use Authorization (EUA). This EUA will remain in effect  (meaning this test can be used) for the duration of the COVID-19 declaration under Section 564(b)(1) of the Act, 21 U.S.C. section 360bbb-3(b)(1), unless the authorization is terminated or revoked.  Performed at [facility], [place] [address], [place], [place] [address]    WBC 11/20/2021 6.1  4.0 - 10.5 K/uL Final   RBC 11/20/2021 4.61  3.87 - 5.11 MIL/uL Final   Hemoglobin 11/20/2021 14.0  12.0 - 15.0 g/dL Final   HCT 11/20/2021 41.8  36.0 - 46.0 % Final   MCV 11/20/2021 90.7  80.0 - 100.0 fL Final   MCH 11/20/2021 30.4  26.0 - 34.0 pg Final   MCHC 11/20/2021 33.5  30.0 - 36.0 g/dL Final   RDW 11/20/2021 13.6  11.5 - 15.5 % Final   Platelets 11/20/2021 294  150 - 400 K/uL Final   nRBC 11/20/2021 0.0  0.0 - 0.2 % Final   Neutrophils Relative % 11/20/2021 48  % Final   Neutro Abs 11/20/2021 2.9  1.7 - 7.7 K/uL Final   Lymphocytes Relative 11/20/2021 38  % Final   Lymphs Abs 11/20/2021 2.3  0.7 - 4.0 K/uL Final   Monocytes Relative 11/20/2021 8  % Final   Monocytes Absolute 11/20/2021 0.5  0.1 - 1.0 K/uL Final   Eosinophils Relative 11/20/2021 5  % Final   Eosinophils Absolute 11/20/2021 0.3  0.0 - 0.5 K/uL Final   Basophils Relative 11/20/2021 1  % Final   Basophils Absolute 11/20/2021 0.0  0.0 - 0.1 K/uL Final   Immature Granulocytes 11/20/2021 0  % Final   Abs Immature Granulocytes 11/20/2021 0.01  0.00 - 0.07 K/uL Final   Performed at [facility], [place] [address] [place], [place] [address]   Sodium 11/20/2021 140  135 - 145 mmol/L Final   Potassium 11/20/2021 3.8  3.5 - 5.1 mmol/L Final   Chloride 11/20/2021 107  98 - 111 mmol/L Final   CO2 11/20/2021 24  22 - 32 mmol/L Final   Glucose, Bld 11/20/2021 81  70 - 99 mg/dL Final   Glucose reference range applies only to samples taken after  fasting for at least 8 hours.   BUN 11/20/2021 9  6 - 20 mg/dL Final   Creatinine, Ser 11/20/2021 1.21 (H)  0.44 - 1.00 mg/dL Final   Calcium 93/26/7124 8.6 (L)  8.9 - 10.3 mg/dL  Final   Total Protein 11/20/2021 6.3 (L)  6.5 - 8.1 g/dL Final   Albumin 58/09/9831 3.3 (L)  3.5 - 5.0 g/dL Final   AST 82/50/5397 19  15 - 41 U/L Final   ALT 11/20/2021 21  0 - 44 U/L Final   Alkaline Phosphatase 11/20/2021 74  38 - 126 U/L Final   Total Bilirubin 11/20/2021 0.5  0.3 - 1.2 mg/dL Final   GFR, Estimated 11/20/2021 60 (L)  >60 mL/min Final   Comment: (NOTE) Calculated using the CKD-EPI Creatinine Equation (2021)    Anion gap 11/20/2021 9  5 - 15 Final   Performed at [facility], [address], [place], [place] [address]   Hgb A1c MFr Bld 11/20/2021 4.8  4.8 - 5.6 % Final   Comment: (NOTE) Pre diabetes:          5.7%-6.4%  Diabetes:              >6.4%  Glycemic control for   <7.0% adults with diabetes    Mean Plasma Glucose 11/20/2021 91.06  mg/dL Final   Performed at [facility], [address], [place], [place] [address]   Magnesium 11/20/2021 2.0  1.7 - 2.4 mg/dL Final   Performed at [facility], [address], [place], [place] [address]   Alcohol, Ethyl (B) 11/20/2021 <10  <10 mg/dL Final   Comment: (NOTE) Lowest detectable limit for serum alcohol is 10 mg/dL.  For medical purposes only. Performed at [facility], [address], [place], [place] [address]    Cholesterol 11/20/2021 154  0 - 200 mg/dL Final   Triglycerides 92/42/6834 46  <150 mg/dL Final   HDL 19/62/2297 54  >40 mg/dL Final   Total CHOL/HDL Ratio 11/20/2021 2.9  RATIO Final   VLDL 11/20/2021 9  0 - 40 mg/dL Final   LDL Cholesterol 11/20/2021 91  0 - 99 mg/dL Final   Comment:        Total Cholesterol/HDL:CHD Risk Coronary Heart Disease Risk Table                     Men   Women  1/2 Average Risk   3.4   3.3  Average Risk       5.0   4.4  2 X Average Risk   9.6   7.1  3 X Average Risk  23.4   11.0        Use the calculated Patient Ratio above and the CHD Risk Table to determine the patient's CHD Risk.        ATP III CLASSIFICATION (LDL):  <100     mg/dL    Optimal  989-211  mg/dL   Near or Above                    Optimal  130-159  mg/dL   Borderline  941-740  mg/dL   High  >814     mg/dL   Very High Performed at [facility], [address] [place], [place] [address]    TSH 11/20/2021 0.515  0.350 - 4.500 uIU/mL Final   Comment: Performed by a 3rd Generation assay with a functional sensitivity  of <=0.01 uIU/mL. Performed at [facility], [address] [place], [place] [address]    RPR Ser Ql 11/20/2021 NON REACTIVE  NON REACTIVE Final   Performed at [facility], [address] [place], [place] [address]   Preg Test, Ur 11/20/2021 NEGATIVE  NEGATIVE Final   Comment:        THE SENSITIVITY OF THIS METHODOLOGY IS >20 mIU/mL. Performed at [facility], [address] [place], [place] [address]    POC Amphetamine UR 11/20/2021 None Detected  NONE DETECTED (Cut Off Level 1000 ng/mL) Final   POC Secobarbital (BAR) 11/20/2021 None Detected  NONE DETECTED (Cut Off Level 300 ng/mL) Final   POC Buprenorphine (BUP) 11/20/2021 None Detected  NONE DETECTED (Cut Off Level 10 ng/mL) Final   POC Oxazepam (BZO) 11/20/2021 None Detected  NONE DETECTED (Cut Off Level 300 ng/mL) Final   POC Cocaine UR 11/20/2021 Positive (A)  NONE DETECTED (Cut Off Level 300 ng/mL) Final   POC Methamphetamine UR 11/20/2021 None Detected  NONE DETECTED (Cut Off Level 1000 ng/mL) Final   POC Morphine 11/20/2021 None Detected  NONE DETECTED (Cut Off Level 300 ng/mL) Final   POC Methadone UR 11/20/2021 None Detected  NONE DETECTED (Cut Off Level 300 ng/mL) Final   POC Oxycodone UR 11/20/2021 None Detected  NONE DETECTED (Cut Off Level 100 ng/mL) Final   POC Marijuana UR 11/20/2021 Positive (A)  NONE DETECTED (Cut Off Level 50 ng/mL) Final   Color, Urine 11/20/2021 YELLOW  YELLOW Final   APPearance 11/20/2021 HAZY (A)  CLEAR Final   Specific Gravity, Urine 11/20/2021 1.018  1.005 - 1.030 Final   pH 11/20/2021 6.0  5.0 - 8.0 Final   Glucose, UA  11/20/2021 NEGATIVE  NEGATIVE mg/dL Final   Hgb urine dipstick 11/20/2021 SMALL (A)  NEGATIVE Final   Bilirubin Urine 11/20/2021 NEGATIVE  NEGATIVE Final   Ketones, ur 11/20/2021 NEGATIVE  NEGATIVE mg/dL Final   Protein, ur 96/29/528410/24/2023 NEGATIVE  NEGATIVE mg/dL Final   Nitrite 13/24/401010/24/2023 NEGATIVE  NEGATIVE Final   Leukocytes,Ua 11/20/2021 SMALL (A)  NEGATIVE Final   RBC / HPF 11/20/2021 6-10  0 - 5 RBC/hpf Final   WBC, UA 11/20/2021 11-20  0 - 5 WBC/hpf Final   Bacteria, UA 11/20/2021 FEW (A)  NONE SEEN Final   Squamous Epithelial / LPF 11/20/2021 0-5  0 - 5 Final   Mucus 11/20/2021 PRESENT   Final   Performed at [facility], [address] [place], [place] [address]   HIV Screen 4th Generation wRfx 11/20/2021 Non Reactive  Non Reactive Final   Performed at [facility], [address] [place], [place] [address]   SARS Coronavirus 2 Ag 11/20/2021 Negative  Negative Final    Blood Alcohol level:  Lab Results  Component Value Date   ETH <10 11/20/2021   ETH <10 03/05/2018    Metabolic Disorder Labs: Lab Results  Component Value Date   HGBA1C 4.8 11/20/2021   MPG 91.06 11/20/2021   No results found for: "PROLACTIN" Lab Results  Component Value Date   CHOL 154 11/20/2021   TRIG 46 11/20/2021   HDL 54 11/20/2021   CHOLHDL 2.9 11/20/2021   VLDL 9 11/20/2021   LDLCALC 91 11/20/2021    Therapeutic Lab Levels: No results found for: "LITHIUM" No results found for: "VALPROATE" No results found for: "CBMZ"  Physical Findings   PHQ2-9    Flowsheet Row ED from [facility]/20[facility] in [facility]  PHQ-2 Total Score 4  PHQ-9 Total Score 20      Flowsheet Row ED from 11/20/2021 in [facility] ED from 03/18/2021 in [facility] EMERGENCY DEPARTMENT ED from 09/18/2020 in [facility] EMERGENCY DEPARTMENT  C-SSRS RISK CATEGORY No Risk No Risk No Risk        Musculoskeletal  Strength  & Muscle Tone: within normal limits Gait & Station: normal Patient leans: N/A  Psychiatric Specialty Exam  Presentation  General Appearance:  Casual  Eye Contact: Fair  Speech: Clear and Coherent  Speech Volume: normal  Mood and Affect  Mood: Euthymic  Affect: Appropriate; Congruent   Thought Process  Thought Processes: Coherent; Goal Directed  Descriptions of Associations:Intact  Orientation:Full (Time, Place and Person)  Thought Content:Logical; WDL  Diagnosis of Schizophrenia or Schizoaffective disorder in past: No    Hallucinations:none Ideas of Reference:None  Suicidal Thoughts: none Homicidal Thoughts: none  Sensorium  Memory: Immediate Fair; Recent Fair  Judgment: Fair  Insight: fair   Executive Functions  Concentration: Fair  Attention Span: Fair  Recall: [employer] of Knowledge: Fair  Language: Fair   Psychomotor Activity  Psychomotor Activity: normal  Assets  Assets: Desire for Improvement; Resilience   Sleep  Sleep: fair    No data recorded   Physical Exam  Physical Exam Constitutional:      Appearance: the patient is not toxic-appearing.  Pulmonary:     Effort: Pulmonary effort is normal.  Neurological:     General: No focal deficit present.     Mental Status: the patient is alert and oriented to person, place, and time.   Review of Systems  Respiratory:  Negative for shortness of breath.   Cardiovascular:  Negative for chest pain.  Gastrointestinal:  Negative for abdominal pain, constipation, diarrhea, nausea and vomiting.  Neurological:  Negative for headaches.   Blood pressure 117/82, pulse 74, temperature 98 F (36.7 C), temperature source Oral, resp. rate 18, SpO2 100 %. There is no height or weight on file to calculate BMI.  Treatment Plan Summary: Daily contact with patient to assess and evaluate symptoms and progress in treatment and Medication management  Status: Voluntary  Major  depressive disorder, PTSD - Patient declines antidepressant medicine at this time -- Patient did not like prazosin  Alcohol use disorder - CIWA with as needed Ativan - No taper given lack of history of withdrawal symptoms and no present withdrawal symptoms  HTN -Losartan 50 mg starting 10/27 -Can add propranolol for further control  Labs: Creatinine of 1.2, likely prerenal; other labs unremarkable, urine pregnancy test negative, GC/chlamydia and ancillary probe pending  Disposition: Patient accepted to [facility] for Monday  Carlyn Reichert, MD 11/23/2021 12:46 PM

## 2021-11-23 NOTE — Progress Notes (Signed)
Pt sleeping with respirations that are even and unlabored.   

## 2021-11-23 NOTE — ED Notes (Signed)
Patient attended group the title was Setbacks in Recovery, we discussed the journey to recovery, which included self-direction, person-centered, empowerment, non-linear it is a step by step process, we discussed peer and family support, respect, hope, strength and responsibility. There is worksheets for them to work on on their own. We discussed identifying triggers and or behaviors that can cause a setback, with or without taking their medication setbacks can happen and how you deal with them will make a big difference in your recovery process, there is a work book with that We discussed coping skills and positive affirmations that you can repeat to help in your daily life. We discussed how do we grow if we would stop doubting ourselves, how far could we possibly go, taking more risks, to live a more fulfilling life, it does not mean there would not problems or things that would happen to cause stress in your life, but knowing how to deal with it, without going over the edge, is important and vital to your well being. The patient was receptive and engaging in the conversation and seemed to enjoy the group meeting. 

## 2021-11-23 NOTE — Progress Notes (Signed)
Received Amanda Gray this AM in the dinning room eating breakfast. Afterwards she made several calls, her mood is calm and pleasant. She was compliant with her medications. She returned to her bed to rest.

## 2021-11-24 DIAGNOSIS — Z1152 Encounter for screening for COVID-19: Secondary | ICD-10-CM | POA: Diagnosis not present

## 2021-11-24 DIAGNOSIS — F419 Anxiety disorder, unspecified: Secondary | ICD-10-CM | POA: Diagnosis not present

## 2021-11-24 DIAGNOSIS — F32A Depression, unspecified: Secondary | ICD-10-CM | POA: Diagnosis not present

## 2021-11-24 DIAGNOSIS — F1994 Other psychoactive substance use, unspecified with psychoactive substance-induced mood disorder: Secondary | ICD-10-CM | POA: Diagnosis not present

## 2021-11-24 MED ORDER — DOCUSATE SODIUM 100 MG PO CAPS
100.0000 mg | ORAL_CAPSULE | Freq: Once | ORAL | Status: AC
  Administered 2021-11-24: 100 mg via ORAL
  Filled 2021-11-24: qty 1

## 2021-11-24 NOTE — ED Notes (Signed)
PT awake/ alert oriented x4. Watching tv in dayroom. Denies SI/HI/AV. States she has had a good day. Will continue to monitor for safety. No noted distress.

## 2021-11-24 NOTE — ED Notes (Signed)
Patient is awake and alert eating lunch in day room.  No complaints or change from earlier status.  Will monitor.

## 2021-11-24 NOTE — ED Notes (Signed)
Patient awake and alert sitting in dayroom eating breakfast and watching tv.  No distress, withdrawal or complaint at this time.  Denies avh shi or plan.  Will monitor and provide a safe environment.

## 2021-11-24 NOTE — ED Notes (Signed)
Patient continues to sit in dayroom where she has been most of the afternoon.  Patient has been coloring and listening to [employer].  She ate dinner and has been without complaint or distress.  Calm, pleasant and without issue.  Denies avh shi or plan.  Will encourage to make needs known and will monitor.

## 2021-11-24 NOTE — ED Provider Notes (Addendum)
Behavioral Health Progress Note  Date and Time: 11/24/2021 8:49 AM Name: Amanda Gray MRN:  [id number]  Subjective:   Amanda Gray is a 36 year old female with no past psychiatric history who presented to the [facility] behavioral health urgent care requesting detox and placement in residential rehab, specifically [facility].   She reports that she is doing okay today.  She reports no withdrawal symptoms.  She reports having some cravings.  She reports her sleep last night was not that great because she woke up a few times.  She reports her appetite is doing good.  She reports she is mildly constipated.  She reports she was able to have a small BM last night but it was very more and so requested something to help with this.  She reports she still wanting to go to [facility] on Monday.  She reports no SI, HI, or AVH.  She reports no other concerns at present.  Diagnosis:  Final diagnoses:  Substance induced mood disorder (HCC)    Total Time spent with patient: 20 minutes  Past Psychiatric History: No significant history Past Medical History:  Past Medical History:  Diagnosis Date   Asthma    Back pain    Seasonal allergies     Past Surgical History:  Procedure Laterality Date   CESAREAN SECTION     CESAREAN SECTION     Family History: No family history on file. Family Psychiatric  History: None on File Social History:  Social History   Substance and Sexual Activity  Alcohol Use No     Social History   Substance and Sexual Activity  Drug Use No    Social History   Socioeconomic History   Marital status: Single    Spouse name: Not on file   Number of children: Not on file   Years of education: Not on file   Highest education level: Not on file  Occupational History   Not on file  Tobacco Use   Smoking status: Every Day    Packs/day: 1.00    Types: Cigarettes   Smokeless tobacco: Never  Substance and Sexual Activity   Alcohol use: No   Drug use: No   Sexual  activity: Not on file  Other Topics Concern   Not on file  Social History Narrative   Not on file   Social Determinants of Health   Financial Resource Strain: Not on file  Food Insecurity: Not on file  Transportation Needs: Not on file  Physical Activity: Not on file  Stress: Not on file  Social Connections: Not on file   SDOH:  SDOH Screenings   Depression (PHQ2-9): High Risk (11/21/2021)  Tobacco Use: High Risk (03/18/2021)   Additional Social History:    Pain Medications: See MAR Prescriptions: See MAR Over the Counter: See MAR History of alcohol / drug use?: Yes Longest period of sobriety (when/how long): several months Negative Consequences of Use: Personal relationships Withdrawal Symptoms: None Name of Substance 1: ETOH 1 - Age of First Use: 20s 1 - Amount (size/oz): 6-24 oz beers 1 - Frequency: daily 1 - Duration: 1 month 1 - Last Use / Amount: last night - 1- 12 oz beer 1 - Method of Aquiring: NA 1- Route of Use: NA Name of Substance 2: Cocaine 2 - Age of First Use: 20s 2 - Amount (size/oz): varies 2 - Frequency: occasional 2 - Duration: NA 2 - Last Use / Amount: 3 days ago - $100 worth 2 - Method of Aquiring:  NA 2 - Route of Substance Use: smokes                Sleep: Fair woke up a few times  Appetite:  Good  Current Medications:  Current Facility-Administered Medications  Medication Dose Route Frequency Provider Last Rate Last Admin   acetaminophen (TYLENOL) tablet 650 mg  650 mg Oral Q6H PRN Ardis Hughs, NP       albuterol (VENTOLIN HFA) 108 (90 Base) MCG/ACT inhaler 1 puff  1 puff Inhalation Q6H PRN Carlyn Reichert, MD       alum & mag hydroxide-simeth (MAALOX/MYLANTA) 200-200-20 MG/5ML suspension 30 mL  30 mL Oral Q4H PRN Ardis Hughs, NP       docusate sodium (COLACE) capsule 100 mg  100 mg Oral Once Lauro Franklin, MD       magnesium hydroxide (MILK OF MAGNESIA) suspension 30 mL  30 mL Oral Daily PRN Ardis Hughs, NP   30 mL at 11/23/21 1844   multivitamin with minerals tablet 1 tablet  1 tablet Oral Daily Ardis Hughs, NP   1 tablet at 11/23/21 0845   nicotine (NICODERM CQ - dosed in mg/24 hours) patch 21 mg  21 mg Transdermal Daily Lauree Chandler, NP   21 mg at 11/23/21 0839   pantoprazole (PROTONIX) EC tablet 20 mg  20 mg Oral BID Ellwood Dense, MD   20 mg at 11/24/21 2841   thiamine (VITAMIN B1) tablet 100 mg  100 mg Oral Daily Ardis Hughs, NP   100 mg at 11/23/21 0839   traZODone (DESYREL) tablet 50 mg  50 mg Oral QHS PRN Onuoha, Chinwendu V, NP   50 mg at 11/23/21 2115   Current Outpatient Medications  Medication Sig Dispense Refill   albuterol (VENTOLIN HFA) 108 (90 Base) MCG/ACT inhaler Inhale 1-2 puffs into the lungs every 4 (four) hours as needed for wheezing. 1 each 0   omeprazole (PRILOSEC) 20 MG capsule Take 1 capsule (20 mg total) by mouth daily. (Patient taking differently: Take 20 mg by mouth daily as needed (For heartburn or acid reflux).) 14 capsule 0    Labs  Lab Results:  Admission on 11/20/2021  Component Date Value Ref Range Status   SARS Coronavirus 2 by RT PCR 11/20/2021 NEGATIVE  NEGATIVE Final   Comment: (NOTE) SARS-CoV-2 target nucleic acids are NOT DETECTED.  The SARS-CoV-2 RNA is generally detectable in upper respiratory specimens during the acute phase of infection. The lowest concentration of SARS-CoV-2 viral copies this assay can detect is 138 copies/mL. A negative result does not preclude SARS-Cov-2 infection and should not be used as the sole basis for treatment or other patient management decisions. A negative result may occur with  improper specimen collection/handling, submission of specimen other than nasopharyngeal swab, presence of viral mutation(s) within the areas targeted by this assay, and inadequate number of viral copies(<138 copies/mL). A negative result must be combined with clinical observations, patient history, and  epidemiological information. The expected result is Negative.  Fact Sheet for Patients:  BloggerCourse.com  Fact Sheet for Healthcare Providers:  SeriousBroker.it  This test is no                          t yet approved or cleared by the [place] FDA and  has been authorized for detection and/or diagnosis of SARS-CoV-2 by FDA under an Emergency Use Authorization (EUA). This EUA will remain  in effect (meaning this test can be used) for the duration of the COVID-19 declaration under Section 564(b)(1) of the Act, 21 U.S.C.section 360bbb-3(b)(1), unless the authorization is terminated  or revoked sooner.       Influenza A by PCR 11/20/2021 NEGATIVE  NEGATIVE Final   Influenza B by PCR 11/20/2021 NEGATIVE  NEGATIVE Final   Comment: (NOTE) The Xpert Xpress SARS-CoV-2/FLU/RSV plus assay is intended as an aid in the diagnosis of influenza from Nasopharyngeal swab specimens and should not be used as a sole basis for treatment. Nasal washings and aspirates are unacceptable for Xpert Xpress SARS-CoV-2/FLU/RSV testing.  Fact Sheet for Patients: BloggerCourse.com  Fact Sheet for Healthcare Providers: SeriousBroker.it  This test is not yet approved or cleared by the [place] FDA and has been authorized for detection and/or diagnosis of SARS-CoV-2 by FDA under an Emergency Use Authorization (EUA). This EUA will remain in effect (meaning this test can be used) for the duration of the COVID-19 declaration under Section 564(b)(1) of the Act, 21 U.S.C. section 360bbb-3(b)(1), unless the authorization is terminated or revoked.  Performed at [facility], [address], [place], [place] [address]    WBC 11/20/2021 6.1  4.0 - 10.5 K/uL Final   RBC 11/20/2021 4.61  3.87 - 5.11 MIL/uL Final   Hemoglobin 11/20/2021 14.0  12.0 - 15.0 g/dL Final   HCT 60/45/4098 41.8  36.0 - 46.0  % Final   MCV 11/20/2021 90.7  80.0 - 100.0 fL Final   MCH 11/20/2021 30.4  26.0 - 34.0 pg Final   MCHC 11/20/2021 33.5  30.0 - 36.0 g/dL Final   RDW 11/91/4782 13.6  11.5 - 15.5 % Final   Platelets 11/20/2021 294  150 - 400 K/uL Final   nRBC 11/20/2021 0.0  0.0 - 0.2 % Final   Neutrophils Relative % 11/20/2021 48  % Final   Neutro Abs 11/20/2021 2.9  1.7 - 7.7 K/uL Final   Lymphocytes Relative 11/20/2021 38  % Final   Lymphs Abs 11/20/2021 2.3  0.7 - 4.0 K/uL Final   Monocytes Relative 11/20/2021 8  % Final   Monocytes Absolute 11/20/2021 0.5  0.1 - 1.0 K/uL Final   Eosinophils Relative 11/20/2021 5  % Final   Eosinophils Absolute 11/20/2021 0.3  0.0 - 0.5 K/uL Final   Basophils Relative 11/20/2021 1  % Final   Basophils Absolute 11/20/2021 0.0  0.0 - 0.1 K/uL Final   Immature Granulocytes 11/20/2021 0  % Final   Abs Immature Granulocytes 11/20/2021 0.01  0.00 - 0.07 K/uL Final   Performed at [facility], [address], [place], [place] [address]   Sodium 11/20/2021 140  135 - 145 mmol/L Final   Potassium 11/20/2021 3.8  3.5 - 5.1 mmol/L Final   Chloride 11/20/2021 107  98 - 111 mmol/L Final   CO2 11/20/2021 24  22 - 32 mmol/L Final   Glucose, Bld 11/20/2021 81  70 - 99 mg/dL Final   Glucose reference range applies only to samples taken after fasting for at least 8 hours.   BUN 11/20/2021 9  6 - 20 mg/dL Final   Creatinine, Ser 11/20/2021 1.21 (H)  0.44 - 1.00 mg/dL Final   Calcium 30/86/5784 8.6 (L)  8.9 - 10.3 mg/dL Final   Total Protein 69/62/9528 6.3 (L)  6.5 - 8.1 g/dL Final   Albumin 41/32/4401 3.3 (L)  3.5 - 5.0 g/dL Final   AST 02/72/5366 19  15 - 41 U/L Final  ALT 11/20/2021 21  0 - 44 U/L Final   Alkaline Phosphatase 11/20/2021 74  38 - 126 U/L Final   Total Bilirubin 11/20/2021 0.5  0.3 - 1.2 mg/dL Final   GFR, Estimated 11/20/2021 60 (L)  >60 mL/min Final   Comment: (NOTE) Calculated using the CKD-EPI Creatinine Equation (2021)    Anion gap 11/20/2021 9  5  - 15 Final   Performed at [facility], [address], [place], [place] [address]   Hgb A1c MFr Bld 11/20/2021 4.8  4.8 - 5.6 % Final   Comment: (NOTE) Pre diabetes:          5.7%-6.4%  Diabetes:              >6.4%  Glycemic control for   <7.0% adults with diabetes    Mean Plasma Glucose 11/20/2021 91.06  mg/dL Final   Performed at [facility], [address], [place], [place] [address]   Magnesium 11/20/2021 2.0  1.7 - 2.4 mg/dL Final   Performed at [facility], [address] [place], [place] [address]   Alcohol, Ethyl (B) 11/20/2021 <10  <10 mg/dL Final   Comment: (NOTE) Lowest detectable limit for serum alcohol is 10 mg/dL.  For medical purposes only. Performed at [facility], [address] [place], [place] [address]    Cholesterol 11/20/2021 154  0 - 200 mg/dL Final   Triglycerides 24/58/0998 46  <150 mg/dL Final   HDL 33/82/5053 54  >40 mg/dL Final   Total CHOL/HDL Ratio 11/20/2021 2.9  RATIO Final   VLDL 11/20/2021 9  0 - 40 mg/dL Final   LDL Cholesterol 11/20/2021 91  0 - 99 mg/dL Final   Comment:        Total Cholesterol/HDL:CHD Risk Coronary Heart Disease Risk Table                     Men   Women  1/2 Average Risk   3.4   3.3  Average Risk       5.0   4.4  2 X Average Risk   9.6   7.1  3 X Average Risk  23.4   11.0        Use the calculated Patient Ratio above and the CHD Risk Table to determine the patient's CHD Risk.        ATP III CLASSIFICATION (LDL):  <100     mg/dL   Optimal  976-734  mg/dL   Near or Above                    Optimal  130-159  mg/dL   Borderline  193-790  mg/dL   High  >240     mg/dL   Very High Performed at [facility], [address], [place], [place] [address]    TSH 11/20/2021 0.515  0.350 - 4.500 uIU/mL Final   Comment: Performed by a 3rd Generation assay with a functional sensitivity of <=0.01 uIU/mL. Performed at [facility], [address] [place], [place] [address]    RPR Ser  Ql 11/20/2021 NON REACTIVE  NON REACTIVE Final   Performed at [facility], [address] [place], [place] [address]   Preg Test, Ur 11/20/2021 NEGATIVE  NEGATIVE Final   Comment:        THE SENSITIVITY OF THIS METHODOLOGY IS >20 mIU/mL. Performed at [facility], [address], [place], [place] [address]    POC Amphetamine UR 11/20/2021 None Detected  NONE DETECTED (Cut Off Level 1000 ng/mL) Final   POC Secobarbital (BAR) 11/20/2021 None Detected  NONE DETECTED (Cut Off Level 300 ng/mL) Final   POC Buprenorphine (BUP) 11/20/2021 None Detected  NONE DETECTED (Cut Off Level 10 ng/mL) Final   POC Oxazepam (BZO) 11/20/2021 None Detected  NONE DETECTED (Cut Off Level 300 ng/mL) Final   POC Cocaine UR 11/20/2021 Positive (A)  NONE DETECTED (Cut Off Level 300 ng/mL) Final   POC Methamphetamine UR 11/20/2021 None Detected  NONE DETECTED (Cut Off Level 1000 ng/mL) Final   POC Morphine 11/20/2021 None Detected  NONE DETECTED (Cut Off Level 300 ng/mL) Final   POC Methadone UR 11/20/2021 None Detected  NONE DETECTED (Cut Off Level 300 ng/mL) Final   POC Oxycodone UR 11/20/2021 None Detected  NONE DETECTED (Cut Off Level 100 ng/mL) Final   POC Marijuana UR 11/20/2021 Positive (A)  NONE DETECTED (Cut Off Level 50 ng/mL) Final   Color, Urine 11/20/2021 YELLOW  YELLOW Final   APPearance 11/20/2021 HAZY (A)  CLEAR Final   Specific Gravity, Urine 11/20/2021 1.018  1.005 - 1.030 Final   pH 11/20/2021 6.0  5.0 - 8.0 Final   Glucose, UA 11/20/2021 NEGATIVE  NEGATIVE mg/dL Final   Hgb urine dipstick 11/20/2021 SMALL (A)  NEGATIVE Final   Bilirubin Urine 11/20/2021 NEGATIVE  NEGATIVE Final   Ketones, ur 11/20/2021 NEGATIVE  NEGATIVE mg/dL Final   Protein, ur 11/20/2021 NEGATIVE  NEGATIVE mg/dL Final   Nitrite 11/20/2021 NEGATIVE  NEGATIVE Final   Leukocytes,Ua 11/20/2021 SMALL (A)  NEGATIVE Final   RBC / HPF 11/20/2021 6-10  0 - 5 RBC/hpf Final   WBC, UA 11/20/2021 11-20  0 - 5 WBC/hpf Final    Bacteria, UA 11/20/2021 FEW (A)  NONE SEEN Final   Squamous Epithelial / LPF 11/20/2021 0-5  0 - 5 Final   Mucus 11/20/2021 PRESENT   Final   Performed at [facility], [place] [address] [place], [place] [address]   HIV Screen 4th Generation wRfx 11/20/2021 Non Reactive  Non Reactive Final   Performed at [facility], [place] [address], [place], [place] [address]   SARS Coronavirus 2 Ag 11/20/2021 Negative  Negative Final    Blood Alcohol level:  Lab Results  Component Value Date   ETH <10 11/20/2021   ETH <10 96/78/9381    Metabolic Disorder Labs: Lab Results  Component Value Date   HGBA1C 4.8 11/20/2021   MPG 91.06 11/20/2021   No results found for: "PROLACTIN" Lab Results  Component Value Date   CHOL 154 11/20/2021   TRIG 46 11/20/2021   HDL 54 11/20/2021   CHOLHDL 2.9 11/20/2021   VLDL 9 11/20/2021   LDLCALC 91 11/20/2021    Therapeutic Lab Levels: No results found for: "LITHIUM" No results found for: "VALPROATE" No results found for: "CBMZ"  Physical Findings   PHQ2-9    Flowsheet Row ED from 11/20/2021 in [facility]  PHQ-2 Total Score 4  PHQ-9 Total Score 20      Flowsheet Row ED from 11/20/2021 in [facility] ED from 03/18/2021 in [place] ED from 09/18/2020 in [place] No Risk No Risk No Risk        Musculoskeletal  Strength & Muscle Tone: within normal limits Gait & Station: normal  Patient leans: N/A  Psychiatric Specialty Exam  Presentation  General Appearance:  Appropriate for Environment; Casual  Eye Contact: Good  Speech: Clear and Coherent; Normal Rate  Speech Volume: Normal  Handedness: Right   Mood and Affect  Mood: -- ("ok")  Affect: Appropriate; Congruent   Thought Process  Thought Processes: Coherent; Goal Directed  Descriptions of  Associations:Intact  Orientation:Full (Time, Place and Person)  Thought Content:Logical; WDL  Diagnosis of Schizophrenia or Schizoaffective disorder in past: No    Hallucinations:Hallucinations: None  Ideas of Reference:None  Suicidal Thoughts:Suicidal Thoughts: No  Homicidal Thoughts:Homicidal Thoughts: No   Sensorium  Memory: Immediate Fair; Recent Fair  Judgment: Good  Insight: Good   Executive Functions  Concentration: Good  Attention Span: Good  Recall: Good  Fund of Knowledge: Good  Language: Good   Psychomotor Activity  Psychomotor Activity:Psychomotor Activity: Normal   Assets  Assets: Communication Skills; Desire for Improvement; Financial Resources/Insurance; Housing; Physical Health; Resilience; Social Support   Sleep  Sleep:Sleep: Fair (woke up a few times)   No data recorded  Physical Exam  Physical Exam Vitals and nursing note reviewed.  Constitutional:      General: She is not in acute distress.    Appearance: Normal appearance. She is not ill-appearing or toxic-appearing.  HENT:     Head: Normocephalic and atraumatic.  Pulmonary:     Effort: Pulmonary effort is normal.  Musculoskeletal:        General: Normal range of motion.  Neurological:     General: No focal deficit present.     Mental Status: She is alert.    Review of Systems  Respiratory:  Negative for cough and shortness of breath.   Cardiovascular:  Negative for chest pain.  Gastrointestinal:  Positive for constipation (mild). Negative for abdominal pain, diarrhea, nausea and vomiting.  Neurological:  Negative for dizziness, weakness and headaches.  Psychiatric/Behavioral:  Negative for depression, hallucinations and suicidal ideas. The patient has insomnia. The patient is not nervous/anxious.    Blood pressure 102/68, pulse 67, temperature 98.1 F (36.7 C), temperature source Tympanic, resp. rate 20, SpO2 100 %. There is no height or weight on file to  calculate BMI.  Treatment Plan Summary: Daily contact with patient to assess and evaluate symptoms and progress in treatment and Medication management  Amanda Gray is a 36 year old female with no past psychiatric history who [facility] detox and placement in residential rehab, specifically [facility].   Amanda Gray is tolerating Detox well without any significant withdrawal.  She tolerated starting Losartan without issues and her pressure has responded well to it.  [facility].  We will not make any changes to her medications at this time.  We will continue to monitor.    MDD, PTSD: -Declines antidepressant medicine at this time -Patient did not like prazosin    Alcohol Use Disorder: -Continue CIWA, last score=0  @ 0607  10/28 -Continue Thiamine 100 mg daily for nutritional supplementation -Continue Multivitamin daily for nutritional supplementation   HTN: -Continue Losartan 50 mg daily -Can add propranolol for further control   Constipation: -One dose of Colace 100 mg -May need additional doses tomorrow if unsuccessful   Asthma: -Continue Albuterol 1 puff q6 PRN   -Continue Protonix 20 mg BID before meals    Labs: Creatinine of 1.2, likely prerenal; other labs unremarkable, urine pregnancy test negative, GC/chlamydia and ancillary probe pending   -Continue PRN's:  Tylenol, Maalox, Atarax, Milk of Magnesia, Trazodone   Lauro Franklin, MD 11/24/2021 8:49 AM

## 2021-11-24 NOTE — ED Notes (Signed)
Pt asleep in bed. Respirations even and unlabored. Monitoring for safety. 

## 2021-11-24 NOTE — ED Notes (Signed)
Patient continues to rest well with no sxs of distress noted - will continue to monitor for safety

## 2021-11-25 ENCOUNTER — Encounter (HOSPITAL_COMMUNITY): Payer: Self-pay | Admitting: Psychiatry

## 2021-11-25 DIAGNOSIS — F419 Anxiety disorder, unspecified: Secondary | ICD-10-CM | POA: Diagnosis not present

## 2021-11-25 DIAGNOSIS — Z1152 Encounter for screening for COVID-19: Secondary | ICD-10-CM | POA: Diagnosis not present

## 2021-11-25 DIAGNOSIS — F32A Depression, unspecified: Secondary | ICD-10-CM | POA: Diagnosis not present

## 2021-11-25 DIAGNOSIS — F1994 Other psychoactive substance use, unspecified with psychoactive substance-induced mood disorder: Secondary | ICD-10-CM | POA: Diagnosis not present

## 2021-11-25 MED ORDER — PANTOPRAZOLE SODIUM 20 MG PO TBEC: 20.0000 mg | DELAYED_RELEASE_TABLET | Freq: Two times a day (BID) | ORAL | 0 refills | Status: DC

## 2021-11-25 MED ORDER — ADULT MULTIVITAMIN W/MINERALS CH: 1.0000 | ORAL_TABLET | Freq: Every day | ORAL | 0 refills | Status: AC

## 2021-11-25 MED ORDER — HYDROXYZINE HCL 25 MG PO TABS
25.0000 mg | ORAL_TABLET | Freq: Four times a day (QID) | ORAL | Status: DC | PRN
  Administered 2021-11-25: 25 mg via ORAL
  Filled 2021-11-25: qty 1

## 2021-11-25 MED ORDER — TRAZODONE HCL 50 MG PO TABS: 50.0000 mg | ORAL_TABLET | Freq: Every evening | ORAL | 0 refills | Status: DC | PRN

## 2021-11-25 MED ORDER — VITAMIN B-1 100 MG PO TABS: 100.0000 mg | ORAL_TABLET | Freq: Every day | ORAL | 0 refills | Status: DC

## 2021-11-25 MED ORDER — NICOTINE 21 MG/24HR TD PT24: 21.0000 mg | MEDICATED_PATCH | Freq: Every day | TRANSDERMAL | 0 refills | Status: DC

## 2021-11-25 NOTE — ED Notes (Signed)
Pt came to nurse with complaint of anxiousness and feeling overwhelmed. Expressed patients complaint to Dr. Candie Chroman. Orders placed for Vistaril

## 2021-11-25 NOTE — ED Provider Notes (Signed)
FBC/OBS ASAP Discharge Summary  Date and Time: 11/25/2021 3:01 PM  Name: Amanda Gray  MRN:  [id number]   Discharge Diagnoses:  Final diagnoses:  Substance induced mood disorder ([place])    Subjective:  Staphanie Gray is a 36 year old female with no past psychiatric history who presented to the [facility] behavioral health urgent care requesting detox and placement in residential rehab, specifically [place].  Patient has been accepted to [facility] with transfer to the facility on 11/26/2021.  During patient's stay, patient has been involved in her care by attending therapy sessions.  Patient has expressed reluctance for p.o. medications for her mental health however she did express interest in therapy.  Patient's detox and subsequent withdrawal have been uncomplicated.  Patient did not require any as needed benzodiazepines.  Patient did have occasional passive SI however she endorsed her child as a protective factor, and these moments of passive SI were fleeting.  Patient has remained intent on continuing her journey towards sobriety and is looking forward to her transfer to rehab.  Stay Summary:   During patient's stay, patient has been involved in her care by attending therapy sessions.  Patient has expressed reluctance for p.o. medications for her mental health however she did express interest in therapy.  Patient's detox and subsequent withdrawal have been uncomplicated.  Patient did not require any as needed benzodiazepines.  Patient did have occasional passive SI however she endorsed her child as a protective factor, and these moments of passive SI were fleeting.  Patient has remained intent on continuing her journey towards sobriety and is looking forward to her transfer to rehab.  During stay, patient only required as needed Atarax x 1, as needed Milk of Magnesia x 1 for constipation, and as needed trazodone 50 mg for insomnia which she did take every night and endorsed good sleep.  Patient  had no behavior issues on the unit.  Total Time spent with patient: 15 minutes  Past Psychiatric History: None Past Medical History:  Past Medical History:  Diagnosis Date   Asthma    Back pain    Seasonal allergies     Past Surgical History:  Procedure Laterality Date   CESAREAN SECTION     CESAREAN SECTION     Family History: No family history on file. Family Psychiatric History:  Social History:  Social History   Substance and Sexual Activity  Alcohol Use No     Social History   Substance and Sexual Activity  Drug Use No    Social History   Socioeconomic History   Marital status: Single    Spouse name: Not on file   Number of children: Not on file   Years of education: Not on file   Highest education level: Not on file  Occupational History   Not on file  Tobacco Use   Smoking status: Every Day    Packs/day: 1.00    Types: Cigarettes   Smokeless tobacco: Never  Substance and Sexual Activity   Alcohol use: No   Drug use: No   Sexual activity: Not on file  Other Topics Concern   Not on file  Social History Narrative   Not on file   Social Determinants of Health   Financial Resource Strain: Not on file  Food Insecurity: Not on file  Transportation Needs: Not on file  Physical Activity: Not on file  Stress: Not on file  Social Connections: Not on file   SDOH:  SDOH Screenings   Depression (PHQ2-9): High  Risk (11/23/2021)  Tobacco Use: High Risk (11/25/2021)    Tobacco Cessation:  A prescription for an FDA-approved tobacco cessation medication provided at discharge  Current Medications:  Current Facility-Administered Medications  Medication Dose Route Frequency Provider Last Rate Last Admin   acetaminophen (TYLENOL) tablet 650 mg  650 mg Oral Q6H PRN Revonda Humphrey, NP       albuterol (VENTOLIN HFA) 108 (90 Base) MCG/ACT inhaler 1 puff  1 puff Inhalation Q6H PRN Corky Sox, MD       alum & mag hydroxide-simeth (MAALOX/MYLANTA)  200-200-20 MG/5ML suspension 30 mL  30 mL Oral Q4H PRN Revonda Humphrey, NP       magnesium hydroxide (MILK OF MAGNESIA) suspension 30 mL  30 mL Oral Daily PRN Revonda Humphrey, NP   30 mL at 11/23/21 1844   multivitamin with minerals tablet 1 tablet  1 tablet Oral Daily Revonda Humphrey, NP   1 tablet at 11/25/21 0900   nicotine (NICODERM CQ - dosed in mg/24 hours) patch 21 mg  21 mg Transdermal Daily Tharon Aquas, NP   21 mg at 11/25/21 0900   pantoprazole (PROTONIX) EC tablet 20 mg  20 mg Oral BID AC Corky Sox, MD   20 mg at 11/25/21 0816   thiamine (VITAMIN B1) tablet 100 mg  100 mg Oral Daily Thomes Lolling H, NP   100 mg at 11/25/21 0900   traZODone (DESYREL) tablet 50 mg  50 mg Oral QHS PRN Onuoha, Chinwendu V, NP   50 mg at 11/24/21 2151   Current Outpatient Medications  Medication Sig Dispense Refill   albuterol (VENTOLIN HFA) 108 (90 Base) MCG/ACT inhaler Inhale 1-2 puffs into the lungs every 4 (four) hours as needed for wheezing. 1 each 0   [START ON 11/26/2021] Multiple Vitamin (MULTIVITAMIN WITH MINERALS) TABS tablet Take 1 tablet by mouth daily. 30 tablet 0   [START ON 11/26/2021] nicotine (NICODERM CQ - DOSED IN MG/24 HOURS) 21 mg/24hr patch Place 1 patch (21 mg total) onto the skin daily. 28 patch 0   pantoprazole (PROTONIX) 20 MG tablet Take 1 tablet (20 mg total) by mouth 2 (two) times daily before a meal. 60 tablet 0   [START ON 11/26/2021] thiamine (VITAMIN B-1) 100 MG tablet Take 1 tablet (100 mg total) by mouth daily. 30 tablet 0   traZODone (DESYREL) 50 MG tablet Take 1 tablet (50 mg total) by mouth at bedtime as needed for sleep. 30 tablet 0    PTA Medications: (Not in a hospital admission)      11/23/2021    3:28 PM 11/21/2021    4:13 PM 11/20/2021    6:35 PM  Depression screen PHQ 2/9  Decreased Interest 3 1 1   Down, Depressed, Hopeless 3 3 2   PHQ - 2 Score 6 4 3   Altered sleeping 3 3 2   Tired, decreased energy 3 3 2   Change in appetite 2  3 1   Feeling bad or failure about yourself  3 3 1   Trouble concentrating 3 3 1   Moving slowly or fidgety/restless 3 0 0  Suicidal thoughts 1 1 0  PHQ-9 Score 24 20 10   Difficult doing work/chores Somewhat difficult Very difficult Somewhat difficult    Flowsheet Row ED from 11/20/2021 in [facility] ED from 03/18/2021 in [place] ED from 09/18/2020 in [place] No Risk No Risk No Risk  Musculoskeletal  Strength & Muscle Tone: within normal limits Gait & Station: normal Patient leans: N/A  Psychiatric Specialty Exam  Presentation  General Appearance:  Appropriate for Environment; Casual  Eye Contact: Fair  Speech: Clear and Coherent  Speech Volume: Normal  Handedness: Right   Mood and Affect  Mood: Euthymic  Affect: Appropriate   Thought Process  Thought Processes: Coherent  Descriptions of Associations:Intact  Orientation:Full (Time, Place and Person)  Thought Content:Logical  Diagnosis of Schizophrenia or Schizoaffective disorder in past: No    Hallucinations:Hallucinations: None  Ideas of Reference:None  Suicidal Thoughts:Suicidal Thoughts: No  Homicidal Thoughts:Homicidal Thoughts: No   Sensorium  Memory: Immediate Good; Recent Fair  Judgment: Good  Insight: Good   Executive Functions  Concentration: Fair  Attention Span: Good  Recall: Good  Fund of Knowledge: Fair  Language: Good   Psychomotor Activity  Psychomotor Activity: Psychomotor Activity: Normal   Assets  Assets: Desire for Improvement; Communication Skills; Housing; Resilience; Social Support   Sleep  Sleep: Sleep: Good   No data recorded  Physical Exam  Physical Exam HENT:     Head: Normocephalic and atraumatic.  Pulmonary:     Effort: Pulmonary effort is normal.  Neurological:     Mental Status: She is  alert and oriented to person, place, and time.    Review of Systems  Psychiatric/Behavioral:  Positive for depression. Negative for hallucinations and suicidal ideas. The patient does not have insomnia.    Blood pressure 127/89, pulse 70, temperature 97.8 F (36.6 C), temperature source Oral, resp. rate 18, SpO2 100 %. There is no height or weight on file to calculate BMI.  Demographic Factors:  Low socioeconomic status  Loss Factors: NA  Historical Factors: NA  Risk Reduction Factors:   Responsible for children under 41 years of age  Continued Clinical Symptoms:  Alcohol/Substance Abuse/Dependencies  Cognitive Features That Contribute To Risk:  None    Suicide Risk:  Minimal: No identifiable suicidal ideation.  Patients presenting with no risk factors but with morbid ruminations; may be classified as minimal risk based on the severity of the depressive symptoms  Plan Of Care/Follow-up recommendations:  Follow up recommendations: - Activity as tolerated. - Diet as recommended by PCP. - Keep all scheduled follow-up appointments as recommended.   Disposition: [facility]    PGY-3 Freida Busman, MD 11/25/2021, 3:01 PM

## 2021-11-25 NOTE — ED Notes (Signed)
Pt sleeping. No noted resp distress. Will continue to monitor for safety 

## 2021-11-25 NOTE — ED Notes (Signed)
Pt asleep in bed. Respirations even and unlabored. Monitoring for safety. 

## 2021-11-25 NOTE — ED Provider Notes (Signed)
Behavioral Health Progress Note  Date and Time: 11/25/2021 11:57 AM Name: Amanda Gray MRN:  [id number]  Subjective:  Amanda Gray is a 36 year old female with no past psychiatric history who presented to the [facility] behavioral health urgent care requesting detox and placement in residential rehab, specifically [place].   On assessment today patient reports that she is doing well.  Patient reports she is having occasional urges and cravings however she denies wanting medication.  Patient reports she attempts to minimize the number of pills she takes as she does not like the sensation of swallowing pills.  Despite this, patient and provider spoke about gabapentin however patient again refused medication.  Patient reports that ultimately she feels she is doing well and endorses that she is sleeping and eating fine.  Patient reports that yesterday she had some passive SI however this resolved after she spoke with her son on the phone.  Patient denies SI, HI and AVH on assessment today.  Patient reports that her constipation has also improved.  Patient was an eager and active participant today during group therapy with this provider.  Patient was very insightful recognizing her triggers for substance use as well as potential obstacles that were increased risk for relapse.  Patient also recognizes her history of difficulty with admitting her emotions, but endorsed that the past few days she has been "more emotional, a mess."  Patient endorses that she is very intent on rehabilitation and sobriety and has found that speaking about her emotions has been more beneficial than keeping them inside.  Patient also endorsed interest in individual therapy when she is discharged from rehab.   Diagnosis:  Final diagnoses:  Substance induced mood disorder ([place])    Total Time spent with patient: 1 hour  Past Psychiatric History: None Past Medical History:  Past Medical History:  Diagnosis Date   Asthma     Back pain    Seasonal allergies     Past Surgical History:  Procedure Laterality Date   CESAREAN SECTION     CESAREAN SECTION     Family History: No family history on file. Family Psychiatric  History: None Social History:  Social History   Substance and Sexual Activity  Alcohol Use No     Social History   Substance and Sexual Activity  Drug Use No    Social History   Socioeconomic History   Marital status: Single    Spouse name: Not on file   Number of children: Not on file   Years of education: Not on file   Highest education level: Not on file  Occupational History   Not on file  Tobacco Use   Smoking status: Every Day    Packs/day: 1.00    Types: Cigarettes   Smokeless tobacco: Never  Substance and Sexual Activity   Alcohol use: No   Drug use: No   Sexual activity: Not on file  Other Topics Concern   Not on file  Social History Narrative   Not on file   Social Determinants of Health   Financial Resource Strain: Not on file  Food Insecurity: Not on file  Transportation Needs: Not on file  Physical Activity: Not on file  Stress: Not on file  Social Connections: Not on file   SDOH:  SDOH Screenings   Depression (PHQ2-9): High Risk (11/23/2021)  Tobacco Use: High Risk (11/25/2021)   Additional Social History:    Pain Medications: See MAR Prescriptions: See MAR Over the Counter: See MAR History  of alcohol / drug use?: Yes Longest period of sobriety (when/how long): several months Negative Consequences of Use: Personal relationships Withdrawal Symptoms: None Name of Substance 1: ETOH 1 - Age of First Use: 20s 1 - Amount (size/oz): 6-24 oz beers 1 - Frequency: daily 1 - Duration: 1 month 1 - Last Use / Amount: last night - 1- 12 oz beer 1 - Method of Aquiring: NA 1- Route of Use: NA Name of Substance 2: Cocaine 2 - Age of First Use: 20s 2 - Amount (size/oz): varies 2 - Frequency: occasional 2 - Duration: NA 2 - Last Use / Amount: 3  days ago - $100 worth 2 - Method of Aquiring: NA 2 - Route of Substance Use: smokes                Sleep: Good  Appetite:  Good  Current Medications:  Current Facility-Administered Medications  Medication Dose Route Frequency Provider Last Rate Last Admin   acetaminophen (TYLENOL) tablet 650 mg  650 mg Oral Q6H PRN Revonda Humphrey, NP       albuterol (VENTOLIN HFA) 108 (90 Base) MCG/ACT inhaler 1 puff  1 puff Inhalation Q6H PRN Corky Sox, MD       alum & mag hydroxide-simeth (MAALOX/MYLANTA) 200-200-20 MG/5ML suspension 30 mL  30 mL Oral Q4H PRN Revonda Humphrey, NP       magnesium hydroxide (MILK OF MAGNESIA) suspension 30 mL  30 mL Oral Daily PRN Revonda Humphrey, NP   30 mL at 11/23/21 1844   multivitamin with minerals tablet 1 tablet  1 tablet Oral Daily Revonda Humphrey, NP   1 tablet at 11/25/21 0900   nicotine (NICODERM CQ - dosed in mg/24 hours) patch 21 mg  21 mg Transdermal Daily Tharon Aquas, NP   21 mg at 11/25/21 0900   pantoprazole (PROTONIX) EC tablet 20 mg  20 mg Oral BID AC Corky Sox, MD   20 mg at 11/25/21 0816   thiamine (VITAMIN B1) tablet 100 mg  100 mg Oral Daily Revonda Humphrey, NP   100 mg at 11/25/21 0900   traZODone (DESYREL) tablet 50 mg  50 mg Oral QHS PRN Onuoha, Chinwendu V, NP   50 mg at 11/24/21 2151   Current Outpatient Medications  Medication Sig Dispense Refill   albuterol (VENTOLIN HFA) 108 (90 Base) MCG/ACT inhaler Inhale 1-2 puffs into the lungs every 4 (four) hours as needed for wheezing. 1 each 0   [START ON 11/26/2021] Multiple Vitamin (MULTIVITAMIN WITH MINERALS) TABS tablet Take 1 tablet by mouth daily. 30 tablet 0   [START ON 11/26/2021] nicotine (NICODERM CQ - DOSED IN MG/24 HOURS) 21 mg/24hr patch Place 1 patch (21 mg total) onto the skin daily. 28 patch 0   pantoprazole (PROTONIX) 20 MG tablet Take 1 tablet (20 mg total) by mouth 2 (two) times daily before a meal. 60 tablet 0   [START ON 11/26/2021]  thiamine (VITAMIN B-1) 100 MG tablet Take 1 tablet (100 mg total) by mouth daily. 30 tablet 0   traZODone (DESYREL) 50 MG tablet Take 1 tablet (50 mg total) by mouth at bedtime as needed for sleep. 30 tablet 0    Labs  Lab Results:  Admission on 11/20/2021  Component Date Value Ref Range Status   SARS Coronavirus 2 by RT PCR 11/20/2021 NEGATIVE  NEGATIVE Final   Comment: (NOTE) SARS-CoV-2 target nucleic acids are NOT DETECTED.  The SARS-CoV-2 RNA is generally detectable in upper respiratory  specimens during the acute phase of infection. The lowest concentration of SARS-CoV-2 viral copies this assay can detect is 138 copies/mL. A negative result does not preclude SARS-Cov-2 infection and should not be used as the sole basis for treatment or other patient management decisions. A negative result may occur with  improper specimen collection/handling, submission of specimen other than nasopharyngeal swab, presence of viral mutation(s) within the areas targeted by this assay, and inadequate number of viral copies(<138 copies/mL). A negative result must be combined with clinical observations, patient history, and epidemiological information. The expected result is Negative.  Fact Sheet for Patients:  EntrepreneurPulse.com.au  Fact Sheet for Healthcare Providers:  IncredibleEmployment.be  This test is no                          t yet approved or cleared by the [place] FDA and  has been authorized for detection and/or diagnosis of SARS-CoV-2 by FDA under an Emergency Use Authorization (EUA). This EUA will remain  in effect (meaning this test can be used) for the duration of the COVID-19 declaration under Section 564(b)(1) of the Act, 21 U.S.C.section 360bbb-3(b)(1), unless the authorization is terminated  or revoked sooner.       Influenza A by PCR 11/20/2021 NEGATIVE  NEGATIVE Final   Influenza B by PCR 11/20/2021 NEGATIVE  NEGATIVE Final    Comment: (NOTE) The Xpert Xpress SARS-CoV-2/FLU/RSV plus assay is intended as an aid in the diagnosis of influenza from Nasopharyngeal swab specimens and should not be used as a sole basis for treatment. Nasal washings and aspirates are unacceptable for Xpert Xpress SARS-CoV-2/FLU/RSV testing.  Fact Sheet for Patients: EntrepreneurPulse.com.au  Fact Sheet for Healthcare Providers: IncredibleEmployment.be  This test is not yet approved or cleared by the [place] FDA and has been authorized for detection and/or diagnosis of SARS-CoV-2 by FDA under an Emergency Use Authorization (EUA). This EUA will remain in effect (meaning this test can be used) for the duration of the COVID-19 declaration under Section 564(b)(1) of the Act, 21 U.S.C. section 360bbb-3(b)(1), unless the authorization is terminated or revoked.  Performed at [facility], [place] [address], [place], [place] [address]    WBC 11/20/2021 6.1  4.0 - 10.5 K/uL Final   RBC 11/20/2021 4.61  3.87 - 5.11 MIL/uL Final   Hemoglobin 11/20/2021 14.0  12.0 - 15.0 g/dL Final   HCT 11/20/2021 41.8  36.0 - 46.0 % Final   MCV 11/20/2021 90.7  80.0 - 100.0 fL Final   MCH 11/20/2021 30.4  26.0 - 34.0 pg Final   MCHC 11/20/2021 33.5  30.0 - 36.0 g/dL Final   RDW 11/20/2021 13.6  11.5 - 15.5 % Final   Platelets 11/20/2021 294  150 - 400 K/uL Final   nRBC 11/20/2021 0.0  0.0 - 0.2 % Final   Neutrophils Relative % 11/20/2021 48  % Final   Neutro Abs 11/20/2021 2.9  1.7 - 7.7 K/uL Final   Lymphocytes Relative 11/20/2021 38  % Final   Lymphs Abs 11/20/2021 2.3  0.7 - 4.0 K/uL Final   Monocytes Relative 11/20/2021 8  % Final   Monocytes Absolute 11/20/2021 0.5  0.1 - 1.0 K/uL Final   Eosinophils Relative 11/20/2021 5  % Final   Eosinophils Absolute 11/20/2021 0.3  0.0 - 0.5 K/uL Final   Basophils Relative 11/20/2021 1  % Final   Basophils Absolute 11/20/2021 0.0  0.0 - 0.1 K/uL Final    Immature  Granulocytes 11/20/2021 0  % Final   Abs Immature Granulocytes 11/20/2021 0.01  0.00 - 0.07 K/uL Final   Performed at [facility] [address], [place], [place] [address]   Sodium 11/20/2021 140  135 - 145 mmol/L Final   Potassium 11/20/2021 3.8  3.5 - 5.1 mmol/L Final   Chloride 11/20/2021 107  98 - 111 mmol/L Final   CO2 11/20/2021 24  22 - 32 mmol/L Final   Glucose, Bld 11/20/2021 81  70 - 99 mg/dL Final   Glucose reference range applies only to samples taken after fasting for at least 8 hours.   BUN 11/20/2021 9  6 - 20 mg/dL Final   Creatinine, Ser 11/20/2021 1.21 (H)  0.44 - 1.00 mg/dL Final   Calcium 11/20/2021 8.6 (L)  8.9 - 10.3 mg/dL Final   Total Protein 11/20/2021 6.3 (L)  6.5 - 8.1 g/dL Final   Albumin 11/20/2021 3.3 (L)  3.5 - 5.0 g/dL Final   AST 11/20/2021 19  15 - 41 U/L Final   ALT 11/20/2021 21  0 - 44 U/L Final   Alkaline Phosphatase 11/20/2021 74  38 - 126 U/L Final   Total Bilirubin 11/20/2021 0.5  0.3 - 1.2 mg/dL Final   GFR, Estimated 11/20/2021 60 (L)  >60 mL/min Final   Comment: (NOTE) Calculated using the CKD-EPI Creatinine Equation (2021)    Anion gap 11/20/2021 9  5 - 15 Final   Performed at [facility] [address], [place], [place] [address]   Hgb A1c MFr Bld 11/20/2021 4.8  4.8 - 5.6 % Final   Comment: (NOTE) Pre diabetes:          5.7%-6.4%  Diabetes:              >6.4%  Glycemic control for   <7.0% adults with diabetes    Mean Plasma Glucose 11/20/2021 91.06  mg/dL Final   Performed at [facility], [place] [address] [place], [place] [address]   Magnesium 11/20/2021 2.0  1.7 - 2.4 mg/dL Final   Performed at [facility] [address], [place], [place] [address]   Alcohol, Ethyl (B) 11/20/2021 <10  <10 mg/dL Final   Comment: (NOTE) Lowest detectable limit for serum alcohol is 10 mg/dL.  For medical purposes only. Performed at [facility], [place] [address], [place], [place] [address]     Cholesterol 11/20/2021 154  0 - 200 mg/dL Final   Triglycerides 11/20/2021 46  <150 mg/dL Final   HDL 11/20/2021 54  >40 mg/dL Final   Total CHOL/HDL Ratio 11/20/2021 2.9  RATIO Final   VLDL 11/20/2021 9  0 - 40 mg/dL Final   LDL Cholesterol 11/20/2021 91  0 - 99 mg/dL Final   Comment:        Total Cholesterol/HDL:CHD Risk Coronary Heart Disease Risk Table                     Men   Women  1/2 Average Risk   3.4   3.3  Average Risk       5.0   4.4  2 X Average Risk   9.6   7.1  3 X Average Risk  23.4   11.0        Use the calculated Patient Ratio above and the CHD Risk Table to determine the patient's CHD Risk.        ATP III CLASSIFICATION (LDL):  <100  mg/dL   Optimal  100-129  mg/dL   Near or Above                    Optimal  130-159  mg/dL   Borderline  160-189  mg/dL   High  >190     mg/dL   Very High Performed at [facility] [address] [place], [place] [address]    TSH 11/20/2021 0.515  0.350 - 4.500 uIU/mL Final   Comment: Performed by a 3rd Generation assay with a functional sensitivity of <=0.01 uIU/mL. Performed at [facility], [place] [address] [place], [place] [address]    RPR Ser Ql 11/20/2021 NON REACTIVE  NON REACTIVE Final   Performed at [facility], [place] [address], [place], [place] [address]   Preg Test, Ur 11/20/2021 NEGATIVE  NEGATIVE Final   Comment:        THE SENSITIVITY OF THIS METHODOLOGY IS >20 mIU/mL. Performed at [facility], [place] [address], [place], [place] [address]    POC Amphetamine UR 11/20/2021 None Detected  NONE DETECTED (Cut Off Level 1000 ng/mL) Final   POC Secobarbital (BAR) 11/20/2021 None Detected  NONE DETECTED (Cut Off Level 300 ng/mL) Final   POC Buprenorphine (BUP) 11/20/2021 None Detected  NONE DETECTED (Cut Off Level 10 ng/mL) Final   POC Oxazepam (BZO) 11/20/2021 None Detected  NONE DETECTED (Cut Off Level 300 ng/mL) Final   POC Cocaine UR 11/20/2021 Positive (A)  NONE DETECTED (Cut Off Level  300 ng/mL) Final   POC Methamphetamine UR 11/20/2021 None Detected  NONE DETECTED (Cut Off Level 1000 ng/mL) Final   POC Morphine 11/20/2021 None Detected  NONE DETECTED (Cut Off Level 300 ng/mL) Final   POC Methadone UR 11/20/2021 None Detected  NONE DETECTED (Cut Off Level 300 ng/mL) Final   POC Oxycodone UR 11/20/2021 None Detected  NONE DETECTED (Cut Off Level 100 ng/mL) Final   POC Marijuana UR 11/20/2021 Positive (A)  NONE DETECTED (Cut Off Level 50 ng/mL) Final   Color, Urine 11/20/2021 YELLOW  YELLOW Final   APPearance 11/20/2021 HAZY (A)  CLEAR Final   Specific Gravity, Urine 11/20/2021 1.018  1.005 - 1.030 Final   pH 11/20/2021 6.0  5.0 - 8.0 Final   Glucose, UA 11/20/2021 NEGATIVE  NEGATIVE mg/dL Final   Hgb urine dipstick 11/20/2021 SMALL (A)  NEGATIVE Final   Bilirubin Urine 11/20/2021 NEGATIVE  NEGATIVE Final   Ketones, ur 11/20/2021 NEGATIVE  NEGATIVE mg/dL Final   Protein, ur 11/20/2021 NEGATIVE  NEGATIVE mg/dL Final   Nitrite 11/20/2021 NEGATIVE  NEGATIVE Final   Leukocytes,Ua 11/20/2021 SMALL (A)  NEGATIVE Final   RBC / HPF 11/20/2021 6-10  0 - 5 RBC/hpf Final   WBC, UA 11/20/2021 11-20  0 - 5 WBC/hpf Final   Bacteria, UA 11/20/2021 FEW (A)  NONE SEEN Final   Squamous Epithelial / LPF 11/20/2021 0-5  0 - 5 Final   Mucus 11/20/2021 PRESENT   Final   Performed at [facility], [place] [address], [place], [place] [address]   HIV Screen 4th Generation wRfx 11/20/2021 Non Reactive  Non Reactive Final   Performed at [facility], [place] [address], [place], [place] [address]   SARS Coronavirus 2 Ag 11/20/2021 Negative  Negative Final    Blood Alcohol level:  Lab Results  Component Value Date   ETH <10 11/20/2021   ETH <10 [id number]    Metabolic Disorder Labs:  Lab Results  Component Value Date   HGBA1C 4.8 11/20/2021   MPG 91.06 11/20/2021   No results found for: "PROLACTIN" Lab Results  Component Value Date   CHOL 154 11/20/2021   TRIG 46 11/20/2021    HDL 54 11/20/2021   CHOLHDL 2.9 11/20/2021   VLDL 9 11/20/2021   LDLCALC 91 11/20/2021    Therapeutic Lab Levels: No results found for: "LITHIUM" No results found for: "VALPROATE" No results found for: "CBMZ"  Physical Findings   PHQ2-9    Flowsheet Row ED from 11/20/2021 in [facility]  PHQ-2 Total Score 6  PHQ-9 Total Score 24      Flowsheet Row ED from 11/20/2021 in [facility] ED from 03/18/2021 in [facility] ED from 09/18/2020 in [place] No Risk No Risk No Risk        Musculoskeletal  Strength & Muscle Tone: within normal limits Gait & Station: normal Patient leans: N/A  Psychiatric Specialty Exam  Presentation  General Appearance:  Appropriate for Environment; Casual  Eye Contact: Fair  Speech: Clear and Coherent  Speech Volume: Normal  Handedness: Right   Mood and Affect  Mood: Euthymic  Affect: Appropriate   Thought Process  Thought Processes: Coherent  Descriptions of Associations:Intact  Orientation:Full (Time, Place and Person)  Thought Content:Logical  Diagnosis of Schizophrenia or Schizoaffective disorder in past: No    Hallucinations:Hallucinations: None  Ideas of Reference:None  Suicidal Thoughts:Suicidal Thoughts: No  Homicidal Thoughts:Homicidal Thoughts: No   Sensorium  Memory: Immediate Good; Recent Fair  Judgment: Good  Insight: Good   Executive Functions  Concentration: Fair  Attention Span: Good  Recall: Good  Fund of Knowledge: Fair  Language: Good   Psychomotor Activity  Psychomotor Activity: Psychomotor Activity: Normal   Assets  Assets: Desire for Improvement; Communication Skills; Housing; Resilience; Social Support   Sleep  Sleep: Sleep: Good   No data recorded  Physical Exam  Physical Exam HENT:     Head:  Normocephalic and atraumatic.  Pulmonary:     Effort: Pulmonary effort is normal.  Neurological:     Mental Status: She is alert and oriented to person, place, and time.    Review of Systems  Gastrointestinal:  Negative for constipation, diarrhea, nausea and vomiting.  Psychiatric/Behavioral:  Negative for hallucinations and suicidal ideas. The patient does not have insomnia.    Blood pressure 127/89, pulse 70, temperature 97.8 F (36.6 C), temperature source Oral, resp. rate 18, SpO2 100 %. There is no height or weight on file to calculate BMI.  Treatment Plan Summary: Daily contact with patient to assess and evaluate symptoms and progress in treatment and Medication management  Jasamine Pottinger is a 36 year old female with no past psychiatric history who presented to the [facility] behavioral health urgent care requesting detox and placement in residential rehab, specifically [place].   Patient appears to overall be doing well with uncomplicated withdrawal.  Patient continues to refuse p.o. medications.  Patient appears ready for discharge tomorrow a.m.  Patient is very insightful and appears ready in the action stage for rehab.   MDD, PTSD: -Declines antidepressant medicine at this time -Patient did not like prazosin     Alcohol Use Disorder: -Continue CIWA, last score=0  @ 0501  10/29 -Continue Thiamine 100 mg daily for nutritional supplementation -Continue Multivitamin daily for nutritional supplementation - Patient declines gabapentin at this time  HTN-resolved Was likely secondary to withdrawal     Constipation: -One dose of Colace 100 mg -May need additional doses tomorrow if unsuccessful     Asthma: -Continue Albuterol 1 puff q6 PRN     -Continue Protonix 20 mg BID before meals     Labs: Creatinine of 1.2, likely prerenal; other labs unremarkable, urine pregnancy test negative, GC/chlamydia and ancillary probe pending            PGY-3 Freida Busman, MD 11/25/2021 11:57 AM

## 2021-11-25 NOTE — ED Notes (Signed)
Pt sitting in dining room watching TV. A&O x4, calm and cooperative. Denies current SI/HI/AVH. No signs of distress noted. Monitoring for safety.  

## 2021-11-25 NOTE — ED Notes (Signed)
Patient is currently attending group session with Dr. Hosie Spangle.

## 2021-11-25 NOTE — ED Notes (Signed)
Patient is in the dining room, she has eaten breakfast, no distress noted, will continue to monitor patient for safety.

## 2021-11-25 NOTE — ED Notes (Signed)
Patient is currently on the phone no distress noted, will continue to monitor patient for safety.

## 2021-11-25 NOTE — ED Notes (Signed)
Pt is currently sleeping, no distress noted, environmental check complete, will continue to monitor patient for safety. ? ?

## 2021-11-26 DIAGNOSIS — F1994 Other psychoactive substance use, unspecified with psychoactive substance-induced mood disorder: Secondary | ICD-10-CM | POA: Diagnosis not present

## 2021-11-26 DIAGNOSIS — Z1152 Encounter for screening for COVID-19: Secondary | ICD-10-CM | POA: Diagnosis not present

## 2021-11-26 DIAGNOSIS — F32A Depression, unspecified: Secondary | ICD-10-CM | POA: Diagnosis not present

## 2021-11-26 DIAGNOSIS — F419 Anxiety disorder, unspecified: Secondary | ICD-10-CM | POA: Diagnosis not present

## 2021-11-26 NOTE — ED Notes (Signed)
Pt is awake and alert.  She has bright affect this morning and mood.  Pt states that she is looking forward to going to [facility].  She denies SI, HI or AVH.  She was given hygiene supplies and is currently in the bathroom.  No distress noted.

## 2021-11-26 NOTE — ED Notes (Signed)
Pt asleep in bed. Respirations even and unlabored. Monitoring for safety. 

## 2021-11-26 NOTE — Discharge Instructions (Signed)

## 2021-12-03 ENCOUNTER — Ambulatory Visit: Payer: Medicaid Other | Admitting: Physician Assistant

## 2021-12-03 ENCOUNTER — Encounter: Payer: Self-pay | Admitting: Physician Assistant

## 2021-12-03 VITALS — BP 135/97 | Ht 63.0 in | Wt 236.0 lb

## 2021-12-03 DIAGNOSIS — F17210 Nicotine dependence, cigarettes, uncomplicated: Secondary | ICD-10-CM | POA: Diagnosis not present

## 2021-12-03 DIAGNOSIS — Z720 Tobacco use: Secondary | ICD-10-CM

## 2021-12-03 DIAGNOSIS — F5104 Psychophysiologic insomnia: Secondary | ICD-10-CM

## 2021-12-03 DIAGNOSIS — F1210 Cannabis abuse, uncomplicated: Secondary | ICD-10-CM

## 2021-12-03 DIAGNOSIS — K5900 Constipation, unspecified: Secondary | ICD-10-CM

## 2021-12-03 DIAGNOSIS — K219 Gastro-esophageal reflux disease without esophagitis: Secondary | ICD-10-CM

## 2021-12-03 DIAGNOSIS — F1010 Alcohol abuse, uncomplicated: Secondary | ICD-10-CM

## 2021-12-03 DIAGNOSIS — J302 Other seasonal allergic rhinitis: Secondary | ICD-10-CM

## 2021-12-03 DIAGNOSIS — F1410 Cocaine abuse, uncomplicated: Secondary | ICD-10-CM | POA: Diagnosis not present

## 2021-12-03 DIAGNOSIS — J4520 Mild intermittent asthma, uncomplicated: Secondary | ICD-10-CM

## 2021-12-03 MED ORDER — TRAZODONE HCL 50 MG PO TABS: ORAL_TABLET | ORAL | 1 refills | Status: AC

## 2021-12-03 MED ORDER — NICOTINE 21 MG/24HR TD PT24: 21.0000 mg | MEDICATED_PATCH | Freq: Every day | TRANSDERMAL | 0 refills | Status: AC

## 2021-12-03 MED ORDER — PANTOPRAZOLE SODIUM 20 MG PO TBEC: 20.0000 mg | DELAYED_RELEASE_TABLET | Freq: Two times a day (BID) | ORAL | 1 refills | Status: DC

## 2021-12-03 MED ORDER — VITAMIN B-1 100 MG PO TABS: 100.0000 mg | ORAL_TABLET | Freq: Every day | ORAL | 1 refills | Status: AC

## 2021-12-03 NOTE — Progress Notes (Unsigned)
New Patient Office Visit  Subjective    Patient ID: Amanda Gray, female    DOB: 1985/11/06  Age: 36 y.o. MRN: [id number]  CC:  Chief Complaint  Patient presents with   Medication Management    HPI Amanda Gray states that she arrived at [facility] on October 30, states that she does plan on returning to her home in [place] when she has completed the program.  States that mood is good, appetite is good.  States that her GERD symptoms are well controlled with Protonix.  States that she is having difficulty staying asleep despite taking 50 mg of trazodone and melatonin.  States that she is able to fall asleep but wakes back up at approximately 3 AM.  States that she has been experiencing constipation since she arrived at [facility].  States that she has had bowel movements, however describes them as incomplete, having to strain to go.  Denies rectal pain or bleeding.  States that she did take a dose of stool softener without relief.  States that she is drinking approximately 6 cups of water a day.    Outpatient Encounter Medications as of 12/03/2021  Medication Sig   albuterol (VENTOLIN HFA) 108 (90 Base) MCG/ACT inhaler Inhale 1-2 puffs into the lungs every 4 (four) hours as needed for wheezing.   Multiple Vitamin (MULTIVITAMIN WITH MINERALS) TABS tablet Take 1 tablet by mouth daily.   nicotine (NICODERM CQ - DOSED IN MG/24 HOURS) 21 mg/24hr patch Place 1 patch (21 mg total) onto the skin daily.   pantoprazole (PROTONIX) 20 MG tablet Take 1 tablet (20 mg total) by mouth 2 (two) times daily before a meal.   thiamine (VITAMIN B-1) 100 MG tablet Take 1 tablet (100 mg total) by mouth daily.   traZODone (DESYREL) 50 MG tablet Take 1 - 2 tabs PO QHS PRN   [DISCONTINUED] nicotine (NICODERM CQ - DOSED IN MG/24 HOURS) 21 mg/24hr patch Place 1 patch (21 mg total) onto the skin daily.   [DISCONTINUED] pantoprazole (PROTONIX) 20 MG tablet Take 1 tablet (20 mg total) by mouth 2 (two) times daily  before a meal.   [DISCONTINUED] thiamine (VITAMIN B-1) 100 MG tablet Take 1 tablet (100 mg total) by mouth daily.   [DISCONTINUED] traZODone (DESYREL) 50 MG tablet Take 1 tablet (50 mg total) by mouth at bedtime as needed for sleep.   No facility-administered encounter medications on file as of 12/03/2021.    Past Medical History:  Diagnosis Date   Acid reflux    Asthma    Back pain    Seasonal allergies     Past Surgical History:  Procedure Laterality Date   CESAREAN SECTION     CESAREAN SECTION      History reviewed. No pertinent family history.  Social History   Socioeconomic History   Marital status: Single    Spouse name: Not on file   Number of children: Not on file   Years of education: Not on file   Highest education level: Not on file  Occupational History   Not on file  Tobacco Use   Smoking status: Every Day    Packs/day: 1.00    Types: Cigarettes   Smokeless tobacco: Never  Substance and Sexual Activity   Alcohol use: No   Drug use: No   Sexual activity: Not on file  Other Topics Concern   Not on file  Social History Narrative   Not on file   Social Determinants of Health   Financial  Resource Strain: Not on file  Food Insecurity: Not on file  Transportation Needs: Not on file  Physical Activity: Not on file  Stress: Not on file  Social Connections: Not on file  Intimate Partner Violence: Not on file    Review of Systems  Constitutional: Negative.   HENT: Negative.    Eyes: Negative.   Respiratory:  Negative for shortness of breath.   Cardiovascular:  Negative for chest pain.  Gastrointestinal:  Positive for constipation. Negative for blood in stool.  Genitourinary: Negative.   Musculoskeletal: Negative.   Skin: Negative.   Neurological: Negative.   Endo/Heme/Allergies: Negative.   Psychiatric/Behavioral:  Negative for depression. The patient has insomnia. The patient is not nervous/anxious.         Objective    BP (!) 135/97 (BP  Location: Right Arm, Patient Position: Sitting)   Ht 5\' 3"  (1.6 m)   Wt 236 lb (107 kg)   BMI 41.81 kg/m   Physical Exam Vitals and nursing note reviewed.  Constitutional:      Appearance: Normal appearance.  HENT:     Head: Normocephalic and atraumatic.     Right Ear: External ear normal.     Left Ear: External ear normal.     Nose: Nose normal.     Mouth/Throat:     Mouth: Mucous membranes are moist.     Pharynx: Oropharynx is clear.  Eyes:     Extraocular Movements: Extraocular movements intact.     Conjunctiva/sclera: Conjunctivae normal.     Pupils: Pupils are equal, round, and reactive to light.  Cardiovascular:     Rate and Rhythm: Normal rate and regular rhythm.     Pulses: Normal pulses.     Heart sounds: Normal heart sounds.  Pulmonary:     Effort: Pulmonary effort is normal.     Breath sounds: Normal breath sounds.  Musculoskeletal:        General: Normal range of motion.     Cervical back: Normal range of motion and neck supple.  Skin:    General: Skin is warm and dry.  Neurological:     General: No focal deficit present.     Mental Status: She is alert and oriented to person, place, and time.  Psychiatric:        Mood and Affect: Mood normal.        Behavior: Behavior normal.        Thought Content: Thought content normal.        Judgment: Judgment normal.       Assessment & Plan:   Problem List Items Addressed This Visit       Respiratory   Mild intermittent asthma without complication     Digestive   Gastroesophageal reflux disease without esophagitis   Relevant Medications   pantoprazole (PROTONIX) 20 MG tablet     Other   Psychophysiological insomnia - Primary   Relevant Medications   traZODone (DESYREL) 50 MG tablet   Alcohol abuse   Relevant Medications   thiamine (VITAMIN B-1) 100 MG tablet   Cocaine abuse (HCC)   Marijuana abuse   Tobacco abuse   Relevant Medications   nicotine (NICODERM CQ - DOSED IN MG/24 HOURS) 21 mg/24hr  patch   Seasonal allergies   Other Visit Diagnoses     Constipation, unspecified constipation type       Hypocalcemia       Relevant Orders   Basic Metabolic Panel      1. Psychophysiological insomnia  Increase trazodone 50 to 100 mg as needed.  Patient education given on good sleep hygiene.  Red flags given for prompt reevaluation. - traZODone (DESYREL) 50 MG tablet; Take 1 - 2 tabs PO QHS PRN  Dispense: 60 tablet; Refill: 1  2. Mild intermittent asthma without complication Continue current regimen, no refill needed today  3. Gastroesophageal reflux disease without esophagitis Continue current regimen - pantoprazole (PROTONIX) 20 MG tablet; Take 1 tablet (20 mg total) by mouth 2 (two) times daily before a meal.  Dispense: 60 tablet; Refill: 1  4. Constipation, unspecified constipation type Patient education given on supportive care, [facility] available at [facility] treatment center  5. Alcohol abuse Continue current regimen, patient currently in subs abuse treatment program - thiamine (VITAMIN B-1) 100 MG tablet; Take 1 tablet (100 mg total) by mouth daily.  Dispense: 30 tablet; Refill: 1  6. Cocaine abuse ([place])   7. Marijuana abuse   8. Tobacco abuse  - nicotine (NICODERM CQ - DOSED IN MG/24 HOURS) 21 mg/24hr patch; Place 1 patch (21 mg total) onto the skin daily.  Dispense: 28 patch; Refill: 0  9. Seasonal allergies   10. Hypocalcemia Patient scheduled for follow-[facility] - Basic Metabolic Panel; Future  No AVS created, no printer available on screening van, patient does not currently have access to MyChart.  Patient education given through teach back method   I have reviewed the patient's medical history (PMH, PSH, Social History, Family History, Medications, and allergies) , and have been updated if relevant. I spent 40 minutes reviewing chart and  face to face time with patient.    Return in about  2 weeks (around 12/17/2021) for with [facility].   Loraine Grip Mayers, PA-C

## 2021-12-04 DIAGNOSIS — F1010 Alcohol abuse, uncomplicated: Secondary | ICD-10-CM | POA: Insufficient documentation

## 2021-12-04 DIAGNOSIS — J302 Other seasonal allergic rhinitis: Secondary | ICD-10-CM | POA: Insufficient documentation

## 2021-12-04 DIAGNOSIS — F1210 Cannabis abuse, uncomplicated: Secondary | ICD-10-CM

## 2021-12-04 DIAGNOSIS — Z720 Tobacco use: Secondary | ICD-10-CM | POA: Insufficient documentation

## 2021-12-04 DIAGNOSIS — J4520 Mild intermittent asthma, uncomplicated: Secondary | ICD-10-CM | POA: Insufficient documentation

## 2021-12-04 DIAGNOSIS — K219 Gastro-esophageal reflux disease without esophagitis: Secondary | ICD-10-CM | POA: Insufficient documentation

## 2021-12-04 DIAGNOSIS — F1410 Cocaine abuse, uncomplicated: Secondary | ICD-10-CM

## 2021-12-04 DIAGNOSIS — F5104 Psychophysiologic insomnia: Secondary | ICD-10-CM | POA: Insufficient documentation

## 2021-12-04 HISTORY — DX: Cocaine abuse, uncomplicated: F14.10

## 2021-12-04 HISTORY — DX: Cannabis abuse, uncomplicated: F12.10

## 2021-12-06 ENCOUNTER — Ambulatory Visit: Payer: Medicaid Other | Attending: Family Medicine

## 2021-12-07 LAB — BASIC METABOLIC PANEL
BUN/Creatinine Ratio: 9 (ref 9–23)
BUN: 11 mg/dL (ref 6–20)
CO2: 25 mmol/L (ref 20–29)
Calcium: 8.6 mg/dL — ABNORMAL LOW (ref 8.7–10.2)
Chloride: 103 mmol/L (ref 96–106)
Creatinine, Ser: 1.2 mg/dL — ABNORMAL HIGH (ref 0.57–1.00)
Glucose: 81 mg/dL (ref 70–99)
Potassium: 4.5 mmol/L (ref 3.5–5.2)
Sodium: 138 mmol/L (ref 134–144)
eGFR: 60 mL/min/{1.73_m2} (ref >59)

## 2022-03-05 ENCOUNTER — Encounter (HOSPITAL_COMMUNITY): Payer: Self-pay

## 2022-03-05 ENCOUNTER — Other Ambulatory Visit: Payer: Self-pay

## 2022-03-05 ENCOUNTER — Emergency Department (HOSPITAL_COMMUNITY)
Admission: EM | Admit: 2022-03-05 | Discharge: 2022-03-06 | Discharge status: Against Medical Advice | Payer: Medicaid Other | Attending: Emergency Medicine | Admitting: Emergency Medicine

## 2022-03-05 DIAGNOSIS — R1032 Left lower quadrant pain: Secondary | ICD-10-CM | POA: Insufficient documentation

## 2022-03-05 DIAGNOSIS — Z5321 Procedure and treatment not carried out due to patient leaving prior to being seen by health care provider: Secondary | ICD-10-CM | POA: Insufficient documentation

## 2022-03-05 DIAGNOSIS — R1013 Epigastric pain: Secondary | ICD-10-CM | POA: Diagnosis not present

## 2022-03-05 DIAGNOSIS — R1031 Right lower quadrant pain: Secondary | ICD-10-CM | POA: Diagnosis not present

## 2022-03-05 LAB — CBC WITH DIFFERENTIAL/PLATELET
Abs Immature Granulocytes: 0.01 10*3/uL (ref 0.00–0.07)
Basophils Absolute: 0 10*3/uL (ref 0.0–0.1)
Basophils Relative: 0 %
Eosinophils Absolute: 0.3 10*3/uL (ref 0.0–0.5)
Eosinophils Relative: 4 %
HCT: 45.7 % (ref 36.0–46.0)
Hemoglobin: 15.5 g/dL — ABNORMAL HIGH (ref 12.0–15.0)
Immature Granulocytes: 0 %
Lymphocytes Relative: 40 %
Lymphs Abs: 2.7 10*3/uL (ref 0.7–4.0)
MCH: 30.9 pg (ref 26.0–34.0)
MCHC: 33.9 g/dL (ref 30.0–36.0)
MCV: 91 fL (ref 80.0–100.0)
Monocytes Absolute: 0.5 10*3/uL (ref 0.1–1.0)
Monocytes Relative: 8 %
Neutro Abs: 3.2 10*3/uL (ref 1.7–7.7)
Neutrophils Relative %: 48 %
Platelets: 238 10*3/uL (ref 150–400)
RBC: 5.02 MIL/uL (ref 3.87–5.11)
RDW: 13.6 % (ref 11.5–15.5)
WBC: 6.7 10*3/uL (ref 4.0–10.5)
nRBC: 0 % (ref 0.0–0.2)

## 2022-03-05 LAB — COMPREHENSIVE METABOLIC PANEL
ALT: 12 U/L (ref 0–44)
AST: 17 U/L (ref 15–41)
Albumin: 3.3 g/dL — ABNORMAL LOW (ref 3.5–5.0)
Alkaline Phosphatase: 75 U/L (ref 38–126)
Anion gap: 6 (ref 5–15)
BUN: 8 mg/dL (ref 6–20)
CO2: 23 mmol/L (ref 22–32)
Calcium: 8.4 mg/dL — ABNORMAL LOW (ref 8.9–10.3)
Chloride: 107 mmol/L (ref 98–111)
Creatinine, Ser: 1.06 mg/dL — ABNORMAL HIGH (ref 0.44–1.00)
GFR, Estimated: >60 mL/min (ref >60)
Glucose, Bld: 89 mg/dL (ref 70–99)
Potassium: 3.3 mmol/L — ABNORMAL LOW (ref 3.5–5.1)
Sodium: 136 mmol/L (ref 135–145)
Total Bilirubin: 0.4 mg/dL (ref 0.3–1.2)
Total Protein: 6.7 g/dL (ref 6.5–8.1)

## 2022-03-05 LAB — URINALYSIS, ROUTINE W REFLEX MICROSCOPIC
Bilirubin Urine: NEGATIVE
Glucose, UA: NEGATIVE mg/dL
Ketones, ur: NEGATIVE mg/dL
Nitrite: NEGATIVE
Protein, ur: NEGATIVE mg/dL
Specific Gravity, Urine: 1.021 (ref 1.005–1.030)
pH: 6 (ref 5.0–8.0)

## 2022-03-05 LAB — I-STAT BETA HCG BLOOD, ED (MC, WL, AP ONLY): I-stat hCG, quantitative: <5 m[IU]/mL (ref <5)

## 2022-03-05 LAB — LIPASE, BLOOD: Lipase: 33 U/L (ref 11–51)

## 2022-03-05 NOTE — ED Notes (Signed)
Pt states she does not want hallway bed and said her and her husband are leaving.

## 2022-03-05 NOTE — ED Notes (Signed)
Pt requesting MyChart activation to be sent to pt's husband phone number. This RN entered phone number that pt provided.

## 2022-03-05 NOTE — ED Triage Notes (Signed)
Pt c/o abdominal pain in RLQ, LLQ, epgastricx3d. Pt denies N/VD.

## 2022-03-11 ENCOUNTER — Emergency Department (HOSPITAL_COMMUNITY)
Admission: EM | Admit: 2022-03-11 | Discharge: 2022-03-11 | Disposition: A | Discharge status: Home / Self Care | Payer: Medicaid Other | Attending: Emergency Medicine | Admitting: Emergency Medicine

## 2022-03-11 ENCOUNTER — Emergency Department (HOSPITAL_COMMUNITY): Payer: Medicaid Other

## 2022-03-11 ENCOUNTER — Encounter (HOSPITAL_COMMUNITY): Payer: Self-pay | Admitting: Emergency Medicine

## 2022-03-11 DIAGNOSIS — J45909 Unspecified asthma, uncomplicated: Secondary | ICD-10-CM | POA: Diagnosis not present

## 2022-03-11 DIAGNOSIS — Y92480 Sidewalk as the place of occurrence of the external cause: Secondary | ICD-10-CM | POA: Insufficient documentation

## 2022-03-11 DIAGNOSIS — Y9301 Activity, walking, marching and hiking: Secondary | ICD-10-CM | POA: Diagnosis not present

## 2022-03-11 DIAGNOSIS — F17210 Nicotine dependence, cigarettes, uncomplicated: Secondary | ICD-10-CM | POA: Diagnosis not present

## 2022-03-11 DIAGNOSIS — W010XXA Fall on same level from slipping, tripping and stumbling without subsequent striking against object, initial encounter: Secondary | ICD-10-CM | POA: Diagnosis not present

## 2022-03-11 DIAGNOSIS — S99912A Unspecified injury of left ankle, initial encounter: Secondary | ICD-10-CM | POA: Diagnosis present

## 2022-03-11 DIAGNOSIS — S82862A Displaced Maisonneuve's fracture of left leg, initial encounter for closed fracture: Secondary | ICD-10-CM | POA: Diagnosis not present

## 2022-03-11 MED ORDER — OXYCODONE HCL 5 MG PO TABS
5.0000 mg | ORAL_TABLET | Freq: Once | ORAL | Status: AC
  Administered 2022-03-11: 5 mg via ORAL
  Filled 2022-03-11: qty 1

## 2022-03-11 MED ORDER — IBUPROFEN 800 MG PO TABS
800.0000 mg | ORAL_TABLET | Freq: Once | ORAL | Status: AC
  Administered 2022-03-11: 800 mg via ORAL
  Filled 2022-03-11: qty 1

## 2022-03-11 MED ORDER — ACETAMINOPHEN 325 MG PO TABS: 650.0000 mg | ORAL_TABLET | Freq: Four times a day (QID) | ORAL | 0 refills | Status: AC | PRN

## 2022-03-11 MED ORDER — ACETAMINOPHEN 500 MG PO TABS
1000.0000 mg | ORAL_TABLET | Freq: Once | ORAL | Status: AC
  Administered 2022-03-11: 1000 mg via ORAL
  Filled 2022-03-11: qty 2

## 2022-03-11 MED ORDER — OXYCODONE HCL 5 MG PO TABS: 5.0000 mg | ORAL_TABLET | ORAL | 0 refills | Status: DC | PRN

## 2022-03-11 MED ORDER — IBUPROFEN 600 MG PO TABS: 600.0000 mg | ORAL_TABLET | Freq: Four times a day (QID) | ORAL | 0 refills | Status: DC | PRN

## 2022-03-11 MED ORDER — KETOROLAC TROMETHAMINE 60 MG/2ML IM SOLN
30.0000 mg | Freq: Once | INTRAMUSCULAR | Status: AC
  Administered 2022-03-11: 30 mg via INTRAMUSCULAR
  Filled 2022-03-11: qty 2

## 2022-03-11 NOTE — Discharge Instructions (Addendum)
It was a pleasure caring for you today in the emergency department.  Please return to the emergency department for any worsening or worrisome symptoms.  Please continue to use crutches and do not put weight on that left leg until seen by orthopedics  Please return if pain becomes severe, you lose sensation to your left foot, develop fever, or worsening/worrisome symptoms occur.

## 2022-03-11 NOTE — ED Provider Notes (Signed)
[name]  CSN: [id number] Arrival date & time: 03/11/22 0720  Chief Complaint(s) Ankle Pain  HPI Amanda Gray is a 37 y.o. female with past medical history as below, significant for asthma, chronic back pain, psycho physiological insomnia, cocaine abuse, alcohol abuse, marijuana abuse, tobacco abuse, substance abuse mood disorder who presents to the ED with complaint of fall, ankle injury.  Patient reports she was walking outside, slipped on the wet sidewalk and fell to the ground.  Hurt her left knee and left ankle.  Did not hit her head.  No head injury, no LOC, no thinners.  Patient reports chronic pain to her left leg but worsened after the fall.  Unable to ambulate after the fall.  No medications prior to arrival.  Accompanied by spouse.  Past Medical History Past Medical History:  Diagnosis Date   Acid reflux    Asthma    Back pain    Seasonal allergies    Patient Active Problem List   Diagnosis Date Noted   Psychophysiological insomnia 12/04/2021   Mild intermittent asthma without complication [id number]   Gastroesophageal reflux disease without esophagitis 12/04/2021   Alcohol abuse 12/04/2021   Cocaine abuse ([place]) 12/04/2021   Marijuana abuse 12/04/2021   Tobacco abuse 12/04/2021   Seasonal allergies 12/04/2021   Substance induced mood disorder ([place]) 11/20/2021   Home Medication(s) Prior to Admission medications   Medication Sig Start Date End Date Taking? Authorizing Provider  acetaminophen (TYLENOL) 325 MG tablet Take 2 tablets (650 mg total) by mouth every 6 (six) hours as needed. 03/11/22  Yes Wynona Dove A, DO  ibuprofen (ADVIL) 600 MG tablet Take 1 tablet (600 mg total) by mouth every 6 (six) hours as needed. 03/11/22  Yes Wynona Dove A, DO  oxyCODONE (ROXICODONE) 5 MG immediate release tablet Take 1 tablet (5 mg total) by mouth every 4 (four) hours as needed for severe pain. 03/11/22  Yes Wynona Dove  A, DO  albuterol (VENTOLIN HFA) 108 (90 Base) MCG/ACT inhaler Inhale 1-2 puffs into the lungs every 4 (four) hours as needed for wheezing. 03/18/21   Sherrell Puller, PA-C  Multiple Vitamin (MULTIVITAMIN WITH MINERALS) TABS tablet Take 1 tablet by mouth daily. 11/26/21   Freida Busman, MD  nicotine (NICODERM CQ - DOSED IN MG/24 HOURS) 21 mg/24hr patch Place 1 patch (21 mg total) onto the skin daily. 12/03/21   Mayers, Cari S, PA-C  pantoprazole (PROTONIX) 20 MG tablet Take 1 tablet (20 mg total) by mouth 2 (two) times daily before a meal. 12/03/21   Mayers, Cari S, PA-C  thiamine (VITAMIN B-1) 100 MG tablet Take 1 tablet (100 mg total) by mouth daily. 12/03/21   Mayers, Cari S, PA-C  traZODone (DESYREL) 50 MG tablet Take 1 - 2 tabs PO QHS PRN 12/03/21   Mayers, Loraine Grip, PA-C  Past Surgical History Past Surgical History:  Procedure Laterality Date   CESAREAN SECTION     CESAREAN SECTION     Family History History reviewed. No pertinent family history.  Social History Social History   Tobacco Use   Smoking status: Every Day    Packs/day: 1.00    Types: Cigarettes   Smokeless tobacco: Never  Substance Use Topics   Alcohol use: No   Drug use: No   Allergies Seasonal ic [octacosanol]  Review of Systems Review of Systems  Constitutional:  Negative for activity change and fever.  HENT:  Negative for facial swelling and trouble swallowing.   Eyes:  Negative for discharge and redness.  Respiratory:  Negative for cough and shortness of breath.   Cardiovascular:  Negative for chest pain and palpitations.  Gastrointestinal:  Negative for abdominal pain and nausea.  Genitourinary:  Negative for dysuria and flank pain.  Musculoskeletal:  Positive for arthralgias. Negative for back pain and gait problem.  Skin:  Negative for pallor and rash.  Neurological:  Negative  for syncope and headaches.    Physical Exam Vital Signs  I have reviewed the triage vital signs BP 126/85 (BP Location: Left Arm)   Pulse 91   Temp 98.6 F (37 C) (Oral)   Resp 18   LMP 02/04/2022   SpO2 97%  Physical Exam Vitals and nursing note reviewed.  Constitutional:      General: She is not in acute distress.    Appearance: She is obese. She is not ill-appearing.  HENT:     Head: Normocephalic and atraumatic.     Right Ear: External ear normal.     Left Ear: External ear normal.     Nose: Nose normal.     Mouth/Throat:     Mouth: Mucous membranes are moist.  Eyes:     General: No scleral icterus.       Right eye: No discharge.        Left eye: No discharge.  Cardiovascular:     Rate and Rhythm: Normal rate and regular rhythm.     Pulses: Normal pulses.  Pulmonary:     Effort: Pulmonary effort is normal. No respiratory distress.     Breath sounds: No stridor.  Abdominal:     General: Abdomen is flat.     Palpations: Abdomen is soft.     Tenderness: There is no abdominal tenderness.  Musculoskeletal:        General: Normal range of motion.     Cervical back: No rigidity.     Right lower leg: No edema.     Left lower leg: No edema.       Feet:  Feet:     Comments: Swelling noted to left ankle Achilles tendon intact DP pulse 2+ left  Skin:    General: Skin is warm and dry.     Capillary Refill: Capillary refill takes less than 2 seconds.  Neurological:     Mental Status: She is alert and oriented to person, place, and time.     GCS: GCS eye subscore is 4. GCS verbal subscore is 5. GCS motor subscore is 6.  Psychiatric:        Mood and Affect: Mood normal.        Behavior: Behavior normal.     ED Results and Treatments Labs (all labs ordered are listed, but only abnormal results are displayed) Labs Reviewed - No data to display  Radiology CT Knee Left Wo Contrast  Result Date: 03/11/2022 CLINICAL DATA:  Knee trauma, tibial plateau fracture suspected EXAM: CT OF THE LEFT KNEE WITHOUT CONTRAST TECHNIQUE: Multidetector CT imaging of the left knee was performed according to the standard protocol. Multiplanar CT image reconstructions were also generated. RADIATION DOSE REDUCTION: This exam was performed according to the departmental dose-optimization program which includes automated exposure control, adjustment of the mA and/or kV according to patient size and/or use of iterative reconstruction technique. COMPARISON:  None Available. FINDINGS: Bones/Joint/Cartilage There is mildly displaced spiral fracture of the proximal fibula. The proximal tibiofibular joint appear intact. There is no fracture of the distal femur, tibia or patella. No appreciable joint effusion. Ligaments Suboptimally assessed by CT. Muscles and Tendons Muscles and tendons appear intact. No evidence of intramuscular hematoma or fluid collection. Soft tissues Skin and subcutaneous soft tissues are unremarkable. IMPRESSION: 1. Mildly displaced spiral fracture of the proximal fibula, which in the presence of ankle fracture is suggested of Maisonneuve fracture. 2. No fracture of the distal femur, tibia or patella 3. Muscles and tendons appear intact. Electronically Signed   By: Keane Police D.O.   On: 03/11/2022 10:34   DG Ankle Complete Left  Result Date: 03/11/2022 CLINICAL DATA:  [place] this morning.  Pain in the LEFT knee and ankle. EXAM: LEFT ANKLE COMPLETE - 3+ VIEW COMPARISON:  03/06/2018 FINDINGS: There is oblique fracture of the distal aspect of the LEFT fibula. There is MEDIAL widening of the mortise. The distal tibia is intact. Significant soft tissue swelling and deformity. IMPRESSION: Oblique fracture of the distal fibula with MEDIAL widening of the mortise. Electronically Signed   By: Nolon Nations M.D.   On: 03/11/2022 08:33   DG Knee Complete 4 Views  Left  Result Date: 03/11/2022 CLINICAL DATA:  Patient fell this morning. Pain in the proximal fibula. EXAM: LEFT KNEE - COMPLETE 4+ VIEW COMPARISON:  LEFT ankle 03/11/2022, LEFT tib fib 03/06/2018 FINDINGS: There is a comminuted fracture of the proximal LEFT fibula. There is subtle lucency in the proximal, MEDIAL aspect of the tibia, raising the question of minimally displaced fracture. No significant joint effusion. No radiopaque foreign body. IMPRESSION: 1. Comminuted fracture of the proximal LEFT fibula. 2. Possible minimally displaced fracture of the proximal tibia. Electronically Signed   By: Nolon Nations M.D.   On: 03/11/2022 08:31    Pertinent labs & imaging results that were available during my care of the patient were reviewed by me and considered in my medical decision making (see MDM for details).  Medications Ordered in ED Medications  ketorolac (TORADOL) injection 30 mg (has no administration in time range)  oxyCODONE (Oxy IR/ROXICODONE) immediate release tablet 5 mg (5 mg Oral Given 03/11/22 0807)  acetaminophen (TYLENOL) tablet 1,000 mg (1,000 mg Oral Given 03/11/22 0807)  oxyCODONE (Oxy IR/ROXICODONE) immediate release tablet 5 mg (5 mg Oral Given 03/11/22 0930)  ibuprofen (ADVIL) tablet 800 mg (800 mg Oral Given 03/11/22 0930)  oxyCODONE (Oxy IR/ROXICODONE) immediate release tablet 5 mg (5 mg Oral Given 03/11/22 1120)  Procedures Procedures  (including critical care time)  Medical Decision Making / ED Course    Medical Decision Making:    TOSCA BRANDIS is a 37 y.o. female with past medical history as below, significant for asthma, chronic back pain, psycho physiological insomnia, cocaine abuse, alcohol abuse, marijuana abuse, tobacco abuse, substance abuse mood disorder who presents to the ED with complaint of fall, ankle injury. . The  complaint involves an extensive differential diagnosis and also carries with it a high risk of complications and morbidity.  Serious etiology was considered. Ddx includes but is not limited to: Sprain, strain, soft tissue injury, fracture, ankle sprain, syndesmosis injury, ligamentous injury, etc.  Complete initial physical exam performed, notably the patient  was tachycardic likely secondary to pain, breathing comfortably on ambient air.  Pain to left ankle and knee on exam.  Extremities warm well-perfused..    Reviewed and confirmed nursing documentation for past medical history, family history, social history.  Vital signs reviewed.      Patient with left lower leg fracture, consistent with Maisonneuve fracture.  Placed in splint, given crutches.  Pain well-controlled with oral analgesics.  Lower extremities are NVI.  Discussed with Ortho, agree with outpatient follow-up.  Work note given.  Analgesics given for home.  Advise she follow-up with Ortho.  The patient improved significantly and was discharged in stable condition. Detailed discussions were had with the patient regarding current findings, and need for close f/u with PCP or on call doctor. The patient has been instructed to return immediately if the symptoms worsen in any way for re-evaluation. Patient verbalized understanding and is in agreement with current care plan. All questions answered prior to discharge.    Additional history obtained: -Additional history obtained from spouse -External records from outside source obtained and reviewed including: Chart review including previous notes, labs, imaging, consultation notes including prior ED visits, home medications, prior labs and imaging   Lab Tests: -I ordered, reviewed, and interpreted labs.   na  EKG   EKG Interpretation  Date/Time:    Ventricular Rate:    PR Interval:    QRS Duration:   QT Interval:    QTC Calculation:   R Axis:     Text Interpretation:            Imaging Studies ordered: I ordered imaging studies including left ankle and knee x-ray, ct knee I independently visualized the following imaging with scope of interpretation limited to determining acute life threatening conditions related to emergency care: Concern for Maisonneuve fracture I independently visualized and interpreted imaging. I agree with the radiologist interpretation   Medicines ordered and prescription drug management: Meds ordered this encounter  Medications   oxyCODONE (Oxy IR/ROXICODONE) immediate release tablet 5 mg   acetaminophen (TYLENOL) tablet 1,000 mg   oxyCODONE (Oxy IR/ROXICODONE) immediate release tablet 5 mg   ibuprofen (ADVIL) tablet 800 mg   oxyCODONE (ROXICODONE) 5 MG immediate release tablet    Sig: Take 1 tablet (5 mg total) by mouth every 4 (four) hours as needed for severe pain.    Dispense:  12 tablet    Refill:  0   ibuprofen (ADVIL) 600 MG tablet    Sig: Take 1 tablet (600 mg total) by mouth every 6 (six) hours as needed.    Dispense:  30 tablet    Refill:  0   acetaminophen (TYLENOL) 325 MG tablet    Sig: Take 2 tablets (650 mg total) by mouth every 6 (six) hours as  needed.    Dispense:  36 tablet    Refill:  0   oxyCODONE (Oxy IR/ROXICODONE) immediate release tablet 5 mg   ketorolac (TORADOL) injection 30 mg    -I have reviewed the patients home medicines and have made adjustments as needed   Consultations Obtained: I requested consultation with the [facility],  and discussed lab and imaging findings as well as pertinent plan - they recommend: see in office   Cardiac Monitoring: The patient was maintained on a cardiac monitor.  I personally viewed and interpreted the cardiac monitored which showed an underlying rhythm of: nsr  Social Determinants of Health:  Diagnosis or treatment significantly limited by social determinants of health: current smoker, alcohol use, and polysubstance abuse   Reevaluation: After the  interventions noted above, I reevaluated the patient and found that they have improved  Co morbidities that complicate the patient evaluation  Past Medical History:  Diagnosis Date   Acid reflux    Asthma    Back pain    Seasonal allergies       Dispostion: Disposition decision including need for hospitalization was considered, and patient discharged from emergency department.    Final Clinical Impression(s) / ED Diagnoses Final diagnoses:  Closed displaced Maisonneuve fracture of left lower extremity, initial encounter     This chart was dictated using voice recognition software.  Despite best efforts to proofread,  errors can occur which can change the documentation meaning.    Wynona Dove A, DO 03/11/22 1121

## 2022-03-11 NOTE — ED Triage Notes (Signed)
Pt slipped and fell this morning. Left ankle pain and swelling. Denies hitting head.

## 2022-03-11 NOTE — ED Notes (Signed)
Pt has leg elevated with pillow under knee and towels under ankle. Ice applied to ankle.

## 2022-03-13 ENCOUNTER — Ambulatory Visit (HOSPITAL_BASED_OUTPATIENT_CLINIC_OR_DEPARTMENT_OTHER): Payer: Medicaid Other | Admitting: Orthopaedic Surgery

## 2022-03-21 ENCOUNTER — Encounter (HOSPITAL_BASED_OUTPATIENT_CLINIC_OR_DEPARTMENT_OTHER): Payer: Self-pay | Admitting: Orthopaedic Surgery

## 2022-03-21 ENCOUNTER — Other Ambulatory Visit: Payer: Self-pay | Admitting: Orthopaedic Surgery

## 2022-03-21 ENCOUNTER — Other Ambulatory Visit: Payer: Self-pay

## 2022-03-22 ENCOUNTER — Ambulatory Visit (HOSPITAL_BASED_OUTPATIENT_CLINIC_OR_DEPARTMENT_OTHER): Payer: Medicaid Other | Admitting: Orthopaedic Surgery

## 2022-03-26 ENCOUNTER — Ambulatory Visit (HOSPITAL_BASED_OUTPATIENT_CLINIC_OR_DEPARTMENT_OTHER)
Admission: RE | Admit: 2022-03-26 | Discharge: 2022-03-26 | Disposition: A | Discharge status: Home / Self Care | Payer: Medicaid Other | Attending: Orthopaedic Surgery | Admitting: Orthopaedic Surgery

## 2022-03-26 ENCOUNTER — Ambulatory Visit (HOSPITAL_BASED_OUTPATIENT_CLINIC_OR_DEPARTMENT_OTHER): Payer: Medicaid Other | Admitting: Certified Registered"

## 2022-03-26 ENCOUNTER — Ambulatory Visit (HOSPITAL_COMMUNITY): Payer: Medicaid Other

## 2022-03-26 ENCOUNTER — Encounter (HOSPITAL_BASED_OUTPATIENT_CLINIC_OR_DEPARTMENT_OTHER): Payer: Self-pay | Admitting: Orthopaedic Surgery

## 2022-03-26 ENCOUNTER — Encounter (HOSPITAL_BASED_OUTPATIENT_CLINIC_OR_DEPARTMENT_OTHER)
Admission: RE | Disposition: A | Discharge status: Home / Self Care | Payer: Self-pay | Source: Home / Self Care | Attending: Orthopaedic Surgery

## 2022-03-26 ENCOUNTER — Other Ambulatory Visit: Payer: Self-pay

## 2022-03-26 DIAGNOSIS — W19XXXA Unspecified fall, initial encounter: Secondary | ICD-10-CM | POA: Insufficient documentation

## 2022-03-26 DIAGNOSIS — Z6837 Body mass index (BMI) 37.0-37.9, adult: Secondary | ICD-10-CM | POA: Insufficient documentation

## 2022-03-26 DIAGNOSIS — S93422A Sprain of deltoid ligament of left ankle, initial encounter: Secondary | ICD-10-CM | POA: Insufficient documentation

## 2022-03-26 DIAGNOSIS — S82832A Other fracture of upper and lower end of left fibula, initial encounter for closed fracture: Secondary | ICD-10-CM | POA: Insufficient documentation

## 2022-03-26 DIAGNOSIS — F17210 Nicotine dependence, cigarettes, uncomplicated: Secondary | ICD-10-CM | POA: Diagnosis not present

## 2022-03-26 DIAGNOSIS — J45909 Unspecified asthma, uncomplicated: Secondary | ICD-10-CM | POA: Insufficient documentation

## 2022-03-26 DIAGNOSIS — Z01818 Encounter for other preprocedural examination: Secondary | ICD-10-CM

## 2022-03-26 DIAGNOSIS — M659 Synovitis and tenosynovitis, unspecified: Secondary | ICD-10-CM | POA: Diagnosis not present

## 2022-03-26 DIAGNOSIS — S93432A Sprain of tibiofibular ligament of left ankle, initial encounter: Secondary | ICD-10-CM

## 2022-03-26 DIAGNOSIS — K219 Gastro-esophageal reflux disease without esophagitis: Secondary | ICD-10-CM | POA: Diagnosis not present

## 2022-03-26 DIAGNOSIS — S82842A Displaced bimalleolar fracture of left lower leg, initial encounter for closed fracture: Secondary | ICD-10-CM | POA: Insufficient documentation

## 2022-03-26 DIAGNOSIS — F17200 Nicotine dependence, unspecified, uncomplicated: Secondary | ICD-10-CM

## 2022-03-26 HISTORY — PX: SYNDESMOSIS REPAIR: SHX5182

## 2022-03-26 HISTORY — PX: ANKLE ARTHROSCOPY WITH OPEN REDUCTION INTERNAL FIXATION (ORIF): SHX5582

## 2022-03-26 LAB — POCT PREGNANCY, URINE: Preg Test, Ur: NEGATIVE

## 2022-03-26 SURGERY — ANKLE ARTHROSCOPY WITH OPEN REDUCTION INTERNAL FIXATION (ORIF)
Anesthesia: General | Site: Ankle | Laterality: Left

## 2022-03-26 MED ORDER — CELECOXIB 200 MG PO CAPS
ORAL_CAPSULE | ORAL | Status: AC
  Filled 2022-03-26: qty 1

## 2022-03-26 MED ORDER — GABAPENTIN 300 MG PO CAPS
ORAL_CAPSULE | ORAL | Status: AC
  Filled 2022-03-26: qty 1

## 2022-03-26 MED ORDER — GABAPENTIN 300 MG PO CAPS
300.0000 mg | ORAL_CAPSULE | Freq: Once | ORAL | Status: AC
  Administered 2022-03-26: 300 mg via ORAL

## 2022-03-26 MED ORDER — MEPERIDINE HCL 25 MG/ML IJ SOLN: 6.2500 mg | INTRAMUSCULAR | Status: DC | PRN

## 2022-03-26 MED ORDER — 0.9 % SODIUM CHLORIDE (POUR BTL) OPTIME
TOPICAL | Status: DC | PRN
  Administered 2022-03-26: 1000 mL

## 2022-03-26 MED ORDER — FENTANYL CITRATE (PF) 100 MCG/2ML IJ SOLN
100.0000 ug | Freq: Once | INTRAMUSCULAR | Status: AC
  Administered 2022-03-26: 100 ug via INTRAVENOUS

## 2022-03-26 MED ORDER — CLONIDINE HCL (ANALGESIA) 100 MCG/ML EP SOLN
EPIDURAL | Status: DC | PRN
  Administered 2022-03-26: 50 ug
  Administered 2022-03-26: 30 ug

## 2022-03-26 MED ORDER — DEXAMETHASONE SODIUM PHOSPHATE 10 MG/ML IJ SOLN
INTRAMUSCULAR | Status: DC | PRN
  Administered 2022-03-26: 5 mg via INTRAVENOUS

## 2022-03-26 MED ORDER — ASPIRIN 81 MG PO TBEC: DELAYED_RELEASE_TABLET | ORAL | 0 refills | Status: AC

## 2022-03-26 MED ORDER — AMISULPRIDE (ANTIEMETIC) 5 MG/2ML IV SOLN
10.0000 mg | Freq: Once | INTRAVENOUS | Status: AC | PRN
  Administered 2022-03-26: 10 mg via INTRAVENOUS

## 2022-03-26 MED ORDER — ONDANSETRON HCL 4 MG/2ML IJ SOLN
INTRAMUSCULAR | Status: AC
  Filled 2022-03-26: qty 2

## 2022-03-26 MED ORDER — MIDAZOLAM HCL 2 MG/2ML IJ SOLN
INTRAMUSCULAR | Status: AC
  Filled 2022-03-26: qty 2

## 2022-03-26 MED ORDER — DEXAMETHASONE SODIUM PHOSPHATE 10 MG/ML IJ SOLN
INTRAMUSCULAR | Status: AC
  Filled 2022-03-26: qty 1

## 2022-03-26 MED ORDER — LIDOCAINE HCL (CARDIAC) PF 100 MG/5ML IV SOSY
PREFILLED_SYRINGE | INTRAVENOUS | Status: DC | PRN
  Administered 2022-03-26: 100 mg via INTRAVENOUS

## 2022-03-26 MED ORDER — FENTANYL CITRATE (PF) 100 MCG/2ML IJ SOLN
INTRAMUSCULAR | Status: AC
  Filled 2022-03-26: qty 2

## 2022-03-26 MED ORDER — CEFAZOLIN SODIUM-DEXTROSE 2-4 GM/100ML-% IV SOLN
INTRAVENOUS | Status: AC
  Filled 2022-03-26: qty 100

## 2022-03-26 MED ORDER — PROMETHAZINE HCL 25 MG/ML IJ SOLN: 6.2500 mg | INTRAMUSCULAR | Status: DC | PRN

## 2022-03-26 MED ORDER — ACETAMINOPHEN 500 MG PO TABS
ORAL_TABLET | ORAL | Status: AC
  Filled 2022-03-26: qty 2

## 2022-03-26 MED ORDER — LIDOCAINE 2% (20 MG/ML) 5 ML SYRINGE
INTRAMUSCULAR | Status: AC
  Filled 2022-03-26: qty 5

## 2022-03-26 MED ORDER — CEFAZOLIN IN SODIUM CHLORIDE 3-0.9 GM/100ML-% IV SOLN: 3.0000 g | INTRAVENOUS | Status: DC

## 2022-03-26 MED ORDER — AMISULPRIDE (ANTIEMETIC) 5 MG/2ML IV SOLN
INTRAVENOUS | Status: AC
  Filled 2022-03-26: qty 4

## 2022-03-26 MED ORDER — LACTATED RINGERS IV SOLN: INTRAVENOUS | Status: DC

## 2022-03-26 MED ORDER — DEXAMETHASONE SODIUM PHOSPHATE 4 MG/ML IJ SOLN
INTRAMUSCULAR | Status: DC | PRN
  Administered 2022-03-26: 3 mg via PERINEURAL
  Administered 2022-03-26: 2 mg via PERINEURAL

## 2022-03-26 MED ORDER — CEFAZOLIN SODIUM-DEXTROSE 2-3 GM-%(50ML) IV SOLR
INTRAVENOUS | Status: DC | PRN
  Administered 2022-03-26: 2 g via INTRAVENOUS

## 2022-03-26 MED ORDER — LACTATED RINGERS IV SOLN: INTRAVENOUS | Status: DC | PRN

## 2022-03-26 MED ORDER — PROPOFOL 10 MG/ML IV BOLUS
INTRAVENOUS | Status: DC | PRN
  Administered 2022-03-26: 200 mg via INTRAVENOUS

## 2022-03-26 MED ORDER — FENTANYL CITRATE (PF) 100 MCG/2ML IJ SOLN: 25.0000 ug | INTRAMUSCULAR | Status: DC | PRN

## 2022-03-26 MED ORDER — PROPOFOL 10 MG/ML IV BOLUS
INTRAVENOUS | Status: AC
  Filled 2022-03-26: qty 20

## 2022-03-26 MED ORDER — FENTANYL CITRATE (PF) 100 MCG/2ML IJ SOLN
INTRAMUSCULAR | Status: DC | PRN
  Administered 2022-03-26: 50 ug via INTRAVENOUS
  Administered 2022-03-26: 25 ug via INTRAVENOUS
  Administered 2022-03-26: 50 ug via INTRAVENOUS
  Administered 2022-03-26: 25 ug via INTRAVENOUS
  Administered 2022-03-26: 50 ug via INTRAVENOUS

## 2022-03-26 MED ORDER — OXYCODONE HCL 5 MG PO TABS: 5.0000 mg | ORAL_TABLET | ORAL | 0 refills | Status: DC | PRN

## 2022-03-26 MED ORDER — ROPIVACAINE HCL 5 MG/ML IJ SOLN
INTRAMUSCULAR | Status: DC | PRN
  Administered 2022-03-26: 30 mL via PERINEURAL
  Administered 2022-03-26: 20 mL via PERINEURAL

## 2022-03-26 MED ORDER — ACETAMINOPHEN 500 MG PO TABS
1000.0000 mg | ORAL_TABLET | Freq: Once | ORAL | Status: AC
  Administered 2022-03-26: 1000 mg via ORAL

## 2022-03-26 MED ORDER — CELECOXIB 200 MG PO CAPS
200.0000 mg | ORAL_CAPSULE | Freq: Once | ORAL | Status: AC
  Administered 2022-03-26: 200 mg via ORAL

## 2022-03-26 MED ORDER — MIDAZOLAM HCL 2 MG/2ML IJ SOLN
2.0000 mg | Freq: Once | INTRAMUSCULAR | Status: AC
  Administered 2022-03-26: 2 mg via INTRAVENOUS

## 2022-03-26 MED ORDER — ONDANSETRON HCL 4 MG/2ML IJ SOLN
INTRAMUSCULAR | Status: DC | PRN
  Administered 2022-03-26: 4 mg via INTRAVENOUS

## 2022-03-26 SURGICAL SUPPLY — 91 items
APL PRP STRL LF DISP 70% ISPRP (MISCELLANEOUS) ×1
APL SKNCLS STERI-STRIP NONHPOA (GAUZE/BANDAGES/DRESSINGS)
BANDAGE ESMARK 6X9 LF (GAUZE/BANDAGES/DRESSINGS) ×1 IMPLANT
BENZOIN TINCTURE PRP APPL 2/3 (GAUZE/BANDAGES/DRESSINGS) IMPLANT
BIT DRILL 2 CANN GRADUATED (BIT) IMPLANT
BIT DRILL 2.5 CANN STRL (BIT) IMPLANT
BIT DRILL 3 CANN ENDOSCOPIC (BIT) IMPLANT
BLADE SURG 15 STRL LF DISP TIS (BLADE) ×2 IMPLANT
BLADE SURG 15 STRL SS (BLADE) ×2
BNDG CMPR 5X4 CHSV STRCH STRL (GAUZE/BANDAGES/DRESSINGS)
BNDG CMPR 5X62 HK CLSR LF (GAUZE/BANDAGES/DRESSINGS) ×2
BNDG CMPR 6"X 5 YARDS HK CLSR (GAUZE/BANDAGES/DRESSINGS) ×2
BNDG CMPR 9X4 STRL LF SNTH (GAUZE/BANDAGES/DRESSINGS)
BNDG CMPR 9X6 STRL LF SNTH (GAUZE/BANDAGES/DRESSINGS) ×1
BNDG COHESIVE 4X5 TAN STRL LF (GAUZE/BANDAGES/DRESSINGS) IMPLANT
BNDG ELASTIC 6INX 5YD STR LF (GAUZE/BANDAGES/DRESSINGS) ×2 IMPLANT
BNDG ESMARK 4X9 LF (GAUZE/BANDAGES/DRESSINGS) IMPLANT
BNDG ESMARK 6X9 LF (GAUZE/BANDAGES/DRESSINGS) ×1
CHLORAPREP W/TINT 26 (MISCELLANEOUS) ×1 IMPLANT
COVER BACK TABLE 60X90IN (DRAPES) ×1 IMPLANT
CUFF TOURN SGL QUICK 34 (TOURNIQUET CUFF) ×1
CUFF TRNQT CYL 34X4.125X (TOURNIQUET CUFF) ×1 IMPLANT
DISSECTOR 3.5MM X 13CM CVD (MISCELLANEOUS) ×1 IMPLANT
DRAPE C-ARM 42X72 X-RAY (DRAPES) ×1 IMPLANT
DRAPE C-ARMOR (DRAPES) ×1 IMPLANT
DRAPE EXTREMITY T 121X128X90 (DISPOSABLE) ×1 IMPLANT
DRAPE IMP U-DRAPE 54X76 (DRAPES) ×1 IMPLANT
DRAPE OEC MINIVIEW 54X84 (DRAPES) IMPLANT
DRAPE U-SHAPE 47X51 STRL (DRAPES) ×1 IMPLANT
ELECT REM PT RETURN 9FT ADLT (ELECTROSURGICAL) ×1
ELECTRODE REM PT RTRN 9FT ADLT (ELECTROSURGICAL) ×1 IMPLANT
EXCALIBUR 3.8MM X 13CM (MISCELLANEOUS) IMPLANT
GAUZE PAD ABD 8X10 STRL (GAUZE/BANDAGES/DRESSINGS) ×1 IMPLANT
GAUZE SPONGE 4X4 12PLY STRL (GAUZE/BANDAGES/DRESSINGS) ×1 IMPLANT
GAUZE XEROFORM 1X8 LF (GAUZE/BANDAGES/DRESSINGS) ×1 IMPLANT
GLOVE BIOGEL M STRL SZ7.5 (GLOVE) ×2 IMPLANT
GLOVE BIOGEL PI IND STRL 8 (GLOVE) ×2 IMPLANT
GOWN STRL REUS W/ TWL LRG LVL3 (GOWN DISPOSABLE) ×1 IMPLANT
GOWN STRL REUS W/ TWL XL LVL3 (GOWN DISPOSABLE) ×2 IMPLANT
GOWN STRL REUS W/TWL LRG LVL3 (GOWN DISPOSABLE) ×1
GOWN STRL REUS W/TWL XL LVL3 (GOWN DISPOSABLE) ×2
MANIFOLD NEPTUNE II (INSTRUMENTS) ×1 IMPLANT
NDL KEITH (NEEDLE) IMPLANT
NDL SUT 6 .5 CRC .975X.05 MAYO (NEEDLE) IMPLANT
NEEDLE KEITH (NEEDLE) IMPLANT
NEEDLE MAYO TAPER (NEEDLE)
NS IRRIG 1000ML POUR BTL (IV SOLUTION) ×1 IMPLANT
PACK ARTHROSCOPY DSU (CUSTOM PROCEDURE TRAY) ×1 IMPLANT
PACK BASIN DAY SURGERY FS (CUSTOM PROCEDURE TRAY) ×1 IMPLANT
PAD CAST 4YDX4 CTTN HI CHSV (CAST SUPPLIES) ×1 IMPLANT
PADDING CAST COTTON 4X4 STRL (CAST SUPPLIES) ×1
PADDING CAST SYNTHETIC 4X4 STR (CAST SUPPLIES) ×2 IMPLANT
PENCIL SMOKE EVACUATOR (MISCELLANEOUS) ×1 IMPLANT
PLATE LOCK DIST FIB 5H TTNIUM (Plate) IMPLANT
SCREW LO-PRO TI 3.5X16MM (Screw) IMPLANT
SCREW LOCK COMP 3X16 (Screw) IMPLANT
SCREW LP CORT 3.0X20 (Screw) IMPLANT
SCREW VAL KREULOCK 3.0X14 TI (Screw) IMPLANT
SCREW VAL KREULOCK 3.0X18 TI (Screw) IMPLANT
SHAVER DISSECTOR 3.0 (BURR) IMPLANT
SHAVER SABRE 2.0 (BURR) IMPLANT
SHEET MEDIUM DRAPE 40X70 STRL (DRAPES) ×1 IMPLANT
SLEEVE SCD COMPRESS KNEE MED (STOCKING) ×1 IMPLANT
SPIKE FLUID TRANSFER (MISCELLANEOUS) IMPLANT
SPLINT PLASTER CAST FAST 5X30 (CAST SUPPLIES) ×20 IMPLANT
SPONGE T-LAP 18X18 ~~LOC~~+RFID (SPONGE) ×1 IMPLANT
STAPLER VISISTAT 35W (STAPLE) IMPLANT
STOCKINETTE 6  STRL (DRAPES) ×1
STOCKINETTE 6 STRL (DRAPES) ×1 IMPLANT
STRAP ANKLE FOOT DISTRACTOR (ORTHOPEDIC SUPPLIES) ×1 IMPLANT
STRIP CLOSURE SKIN 1/2X4 (GAUZE/BANDAGES/DRESSINGS) IMPLANT
SUCTION FRAZIER HANDLE 10FR (MISCELLANEOUS) ×1
SUCTION TUBE FRAZIER 10FR DISP (MISCELLANEOUS) ×1 IMPLANT
SUT BONE WAX W31G (SUTURE) IMPLANT
SUT ETHILON 3 0 PS 1 (SUTURE) ×1 IMPLANT
SUT FIBERWIRE #2 38 T-5 BLUE (SUTURE)
SUT FIBERWIRE 2-0 18 17.9 3/8 (SUTURE)
SUT MNCRL AB 3-0 PS2 18 (SUTURE) ×1 IMPLANT
SUT PDS AB 2-0 CT2 27 (SUTURE) ×1 IMPLANT
SUT VIC AB 2-0 SH 27 (SUTURE) ×1
SUT VIC AB 2-0 SH 27XBRD (SUTURE) IMPLANT
SUT VIC AB 3-0 FS2 27 (SUTURE) IMPLANT
SUT VICRYL 0 SH 27 (SUTURE) IMPLANT
SUTURE FIBERWR #2 38 T-5 BLUE (SUTURE) IMPLANT
SUTURE FIBERWR 2-0 18 17.9 3/8 (SUTURE) IMPLANT
SYNDESMOSIS TIGHTROPE XP (Orthopedic Implant) IMPLANT
SYR BULB EAR ULCER 3OZ GRN STR (SYRINGE) ×1 IMPLANT
TOWEL GREEN STERILE FF (TOWEL DISPOSABLE) ×2 IMPLANT
TUBE CONNECTING 20X1/4 (TUBING) ×1 IMPLANT
TUBING ARTHROSCOPY IRRIG 16FT (MISCELLANEOUS) ×1 IMPLANT
UNDERPAD 30X36 HEAVY ABSORB (UNDERPADS AND DIAPERS) ×1 IMPLANT

## 2022-03-26 NOTE — Anesthesia Postprocedure Evaluation (Signed)
Anesthesia Post Note  Patient: Amanda Gray  Procedure(s) Performed: LEFT ANKLE ARTHROSCOPY, OPEN TREATMENT OF BIMALLEOLAR ANKLE FRACTURE, CLOSED TREATMENT OF FIBULAR SHAFT FRACTURE (Left: Ankle) SYNDESMOSIS REPAIR (Left: Ankle)     Patient location during evaluation: PACU Anesthesia Type: General Level of consciousness: awake and alert Pain management: pain level controlled Vital Signs Assessment: post-procedure vital signs reviewed and stable Respiratory status: spontaneous breathing, nonlabored ventilation, respiratory function stable and patient connected to nasal cannula oxygen Cardiovascular status: blood pressure returned to baseline and stable Postop Assessment: no apparent nausea or vomiting Anesthetic complications: no  No notable events documented.  Last Vitals:  Vitals:   03/26/22 1230 03/26/22 1254  BP: 114/75 (!) 125/90  Pulse: 70 68  Resp: 19 20  Temp:  (!) 36.3 C  SpO2: 92% 94%    Last Pain:  Vitals:   03/26/22 1254  TempSrc: Oral  PainSc: 0-No pain                 [name]

## 2022-03-26 NOTE — Anesthesia Procedure Notes (Signed)
Anesthesia Regional Block: Adductor canal block   Pre-Anesthetic Checklist: , timeout performed,  Correct Patient, Correct Site, Correct Laterality,  Correct Procedure, Correct Position, site marked,  Risks and benefits discussed,  Surgical consent,  Pre-op evaluation,  At surgeon's request and post-op pain management  Laterality: Lower and Left  Prep: chloraprep       Needles:  Injection technique: Single-shot  Needle Type: Stimiplex     Needle Length: 9cm  Needle Gauge: 21     Additional Needles:   Procedures:,,,, ultrasound used (permanent image in chart),,    Narrative:  Start time: 03/26/2022 9:17 AM End time: 03/26/2022 9:35 AM Injection made incrementally with aspirations every 5 mL.  Performed by: Personally  Anesthesiologist: Nolon Nations, MD  Additional Notes: BP cuff, EKG monitors applied. Sedation begun. Artery and nerve location verified with ultrasound. Anesthetic injected incrementally (80m), slowly, and after negative aspirations under direct u/s guidance. Good fascial/perineural spread. Tolerated well.

## 2022-03-26 NOTE — Transfer of Care (Signed)
Immediate Anesthesia Transfer of Care Note  Patient: Amanda Gray  Procedure(s) Performed: LEFT ANKLE ARTHROSCOPY, OPEN TREATMENT OF BIMALLEOLAR ANKLE FRACTURE, CLOSED TREATMENT OF FIBULAR SHAFT FRACTURE (Left: Ankle) SYNDESMOSIS REPAIR (Left: Ankle)  Patient Location: PACU  Anesthesia Type:GA combined with regional for post-op pain  Level of Consciousness: drowsy and patient cooperative  Airway & Oxygen Therapy: Patient Spontanous Breathing and Patient connected to face mask oxygen  Post-op Assessment: Report given to RN and Post -op Vital signs reviewed and stable  Post vital signs: Reviewed and stable  Last Vitals:  Vitals Value Taken Time  BP    Temp    Pulse    Resp    SpO2      Last Pain:  Vitals:   03/26/22 0818  TempSrc: Oral  PainSc: 9          Complications: No notable events documented.

## 2022-03-26 NOTE — Progress Notes (Signed)
  Assisted Dr. Lissa Hoard with left, adductor canal, popliteal, ultrasound guided block. Side rails up, monitors on throughout procedure. See vital signs in flow sheet. Tolerated Procedure well.

## 2022-03-26 NOTE — H&P (Signed)
PREOPERATIVE H&P  Chief Complaint: Left ankle pain  HPI: Amanda Gray is a 37 y.o. female who presents for preoperative history and physical with a diagnosis of left bimalleolar ankle fracture with possible syndesmosis disruption and deltoid ligament disruption.  She also has a proximal fibula fracture.  There is concern for syndesmotic instability and there is displacement of her fracture so she is here today for surgical intervention.  She has been maintaining nonweightbearing.  She has been taking liquid pain medicine and is requesting pill pain medication. Symptoms are rated as moderate to severe, and have been worsening.  This is significantly impairing activities of daily living.  She has elected for surgical management.   Past Medical History:  Diagnosis Date   Acid reflux    Asthma    Back pain    Seasonal allergies    Past Surgical History:  Procedure Laterality Date   CESAREAN SECTION     CESAREAN SECTION     Social History   Socioeconomic History   Marital status: Single    Spouse name: Not on file   Number of children: Not on file   Years of education: Not on file   Highest education level: Not on file  Occupational History   Not on file  Tobacco Use   Smoking status: Every Day    Packs/day: 1.00    Types: Cigarettes   Smokeless tobacco: Never  Vaping Use   Vaping Use: Never used  Substance and Sexual Activity   Alcohol use: No   Drug use: Not Currently    Types: Marijuana, Cocaine    Comment: + uds 10/2021, last smoked last week   Sexual activity: Not on file  Other Topics Concern   Not on file  Social History Narrative   Not on file   Social Determinants of Health   Financial Resource Strain: Not on file  Food Insecurity: Not on file  Transportation Needs: Not on file  Physical Activity: Not on file  Stress: Not on file  Social Connections: Not on file   History reviewed. No pertinent family history. No Known Allergies Prior to Admission  medications   Medication Sig Start Date End Date Taking? Authorizing Provider  acetaminophen (TYLENOL) 325 MG tablet Take 2 tablets (650 mg total) by mouth every 6 (six) hours as needed. 03/11/22  Yes Wynona Dove A, DO  ibuprofen (ADVIL) 600 MG tablet Take 1 tablet (600 mg total) by mouth every 6 (six) hours as needed. 03/11/22  Yes Jeanell Sparrow, DO  Multiple Vitamin (MULTIVITAMIN WITH MINERALS) TABS tablet Take 1 tablet by mouth daily. 11/26/21  Yes Freida Busman, MD  oxyCODONE (ROXICODONE) 5 MG immediate release tablet Take 1 tablet (5 mg total) by mouth every 4 (four) hours as needed for severe pain. 03/11/22  Yes Wynona Dove A, DO  pantoprazole (PROTONIX) 20 MG tablet Take 1 tablet (20 mg total) by mouth 2 (two) times daily before a meal. 12/03/21  Yes Mayers, Cari S, PA-C  thiamine (VITAMIN B-1) 100 MG tablet Take 1 tablet (100 mg total) by mouth daily. 12/03/21  Yes Mayers, Cari S, PA-C  traZODone (DESYREL) 50 MG tablet Take 1 - 2 tabs PO QHS PRN 12/03/21  Yes Mayers, Cari S, PA-C  albuterol (VENTOLIN HFA) 108 (90 Base) MCG/ACT inhaler Inhale 1-2 puffs into the lungs every 4 (four) hours as needed for wheezing. 03/18/21   Sherrell Puller, PA-C  nicotine (NICODERM CQ - DOSED IN MG/24 HOURS) 21 mg/24hr patch  Place 1 patch (21 mg total) onto the skin daily. 12/03/21   Mayers, Cari S, PA-C     Positive ROS: All other systems have been reviewed and were otherwise negative with the exception of those mentioned in the HPI and as above.  Physical Exam:  Vitals:   03/26/22 0818  BP: (!) 141/104  Pulse: 91  Temp: 98.2 F (36.8 C)  SpO2: 96%   General: Alert, no acute distress Cardiovascular: No pedal edema Respiratory: No cyanosis, no use of accessory musculature GI: No organomegaly, abdomen is soft and non-tender Skin: No lesions in the area of chief complaint Neurologic: Sensation intact distally Psychiatric: Patient is competent for consent with normal mood and affect Lymphatic: No  axillary or cervical lymphadenopathy  MUSCULOSKELETAL: Left ankle in a short leg splint.  She has some bruising along the proximal aspect of the fibula.  She has some tenderness to palpation there.  The splint has loosened due to decreased swelling.  Toes are warm and well-perfused.  She is able to wiggle toes.  Endorses sensation to light touch about the toes.  Assessment: Left bimalleolar ankle fracture, lateral and posterior with proximal fibula fracture and deltoid ligament disruption   Plan: Plan for open treatment of her ankle fracture with possible syndesmosis fixation.  Patient does have disruption of her deltoid ligament there is concern for intra-articular loose bodies and therefore will perform arthroscopy with debridement of torn capsular tissue, ligament, cartilage and bone if necessary.  Will also plan to remove any loose floating fragments at the time.  She does understand that she will need to maintain nonweightbearing approximately 6 weeks postoperatively and agrees to comply.  We discussed the risks, benefits and alternatives of surgery which include but are not limited to wound healing complications, infection, nonunion, malunion, need for further surgery, damage to surrounding structures and continued pain.  They understand there is no guarantees to an acceptable outcome.  After weighing these risks they opted to proceed with surgery.     Erle Crocker, MD    03/26/2022 9:29 AM

## 2022-03-26 NOTE — Anesthesia Procedure Notes (Signed)
Procedure Name: LMA Insertion Date/Time: 03/26/2022 10:17 AM  Performed by: Verita Lamb, CRNAPre-anesthesia Checklist: Patient identified, Emergency Drugs available, Suction available and Patient being monitored Patient Re-evaluated:Patient Re-evaluated prior to induction Oxygen Delivery Method: Circle system utilized Preoxygenation: Pre-oxygenation with 100% oxygen Induction Type: IV induction Ventilation: Mask ventilation without difficulty LMA: LMA inserted LMA Size: 4.0 Number of attempts: 1 Airway Equipment and Method: Bite block Placement Confirmation: positive ETCO2, breath sounds checked- equal and bilateral and CO2 detector Tube secured with: Tape Dental Injury: Teeth and Oropharynx as per pre-operative assessment

## 2022-03-26 NOTE — Op Note (Signed)
Amanda Gray female 37 y.o. 03/26/2022  PreOperative Diagnosis: Left bimalleolar ankle fracture, lateral and posterior Deltoid ligament disruption Syndesmosis disruption Proximal fibular shaft fracture  PostOperative Diagnosis: Same  PROCEDURE: Left ankle arthroscopic debridement, extensive Open treatment of bimalleolar ankle fracture, lateral and posterior Open treatment of syndesmosis Closed treatment of proximal fibular shaft fracture Ankle stress view fluoroscopy  SURGEON: Melony Overly, MD  ASSISTANT: Jesse Martinique, PA-C  ANESTHESIA: General with peripheral nerve block  FINDINGS: See below  IMPLANTS: Arthrex distal fibular locking plate and tight rope  INDICATIONS:37 y.o. female sustained the above injury after a fall.  She was seen in the emergency department where she was placed in a splint after closed reduction.  Given the nature of her fracture she was indicated for surgery.   Patient understood the risks, benefits and alternatives to surgery which include but are not limited to wound healing complications, infection, nonunion, malunion, need for further surgery as well as damage to surrounding structures. They also understood the potential for continued pain in that there were no guarantees of acceptable outcome After weighing these risks the patient opted to proceed with surgery.  PROCEDURE: Patient was identified in the preoperative holding area.  The left ankle was marked by myself.  Consent was signed by myself and the patient.  Block was performed by anesthesia in the preoperative holding area.  Patient was taken to the operative suite and placed supine on the operative table.  General LMA anesthesia was induced without difficulty. Bump was placed under the operative hip and bone foam was used.  All bony prominences were well padded.  Tourniquet was placed on the operative thigh.  Preoperative antibiotics were given. The extremity was prepped and draped in  the usual sterile fashion and surgical timeout was performed.  The limb was elevated and the tourniquet was inflated to 250 mmHg.  We began by placing the ankle distractor at the appropriate tension.   We began by insufflating the ankle joint using an 18-gauge normal saline.  We then proceeded by making an anteromedial portal to the ankle joint.  This was done with an 11 blade through the skin.  Then blunt dissection was used with a hemostat down to the capsule.  Then the capsule was violated and the joint entered.  Then the trocar with the camera was placed.  There was a large amount of fracture hematoma and free-floating cartilage fragments as well as bony fragments. Then a lateral portal was placed in a similar fashion.  The probe was placed to remove the synovitic tissue and the joint surfaces were inspected.    Upon inspection of the joint there was found to be with some partial-thickness cartilage wear and chondrosis.  Then the probe was removed and the shaver was inserted.  Extensive debridement was done of the ankle joint including synovial tissue and evidence of chondral impingement on the anterior distal tibial plafond loose floating cartilage fragments and bone from the fracture.  Also some of the capsular ligamentous tissue was debrided as well.  The fracture hematoma and hemarthrosis was evacuated.    The joint surfaces were inspected for any evidence of loose cartilage.  There was some remaining chondrosis notably within the lateral gutter and of the lateral anterior tibial plafond.  There is also some synovitis within the syndesmosis that was debrided back. This was debrided extensively. This completed the arthroscopic debridement portion of the case.   We began by making a longitudinal incision overlying the distal fibula.  This was taken sharply down through skin and subcutaneous tissue.  Blunt dissection was used to identify any branch of the superficial peroneal nerve which was which was not  identified.  The incision was then taken sharply down to bone and the fracture site was identified.  The fracture site was mobilized.   The fracture site were cleaned with a rondure and curette of any fracture hematoma and callus formation.  Then the fracture of the fibula was reduced under direct visualization and held provisionally with a lobster claw.  Then fluoroscopy confirmed adequate reduction of the ankle mortise at that time.  Then a combination of locking and nonlocking screws were used after placement of a lag screw across the fracture by technique.  This provided good stability of the distal fibula fracture.    Intraoperative fluoroscopy confirmed appropriate reduction of the posterior malleolar fragment.  It was too small for internal fixation.  Then under fluoroscopy the ankle was stressed and found to be unstable with regard to medial clear space widening and syndesmotic widening.   We proceeded with open treatment of the syndesmosis.  Separate deep incision was carried out anterior to the distal fibula at the level of the syndesmosis and the incisura.  The syndesmosis was loose with manual manipulation.  The syndesmosis reduced under direct visualization help visually with Weber clamp.  The fluoroscopy confirmed appropriate position of the syndesmosis and a tight rope was placed across syndesmosis in standard fashion.  After placement of the tight rope and under maximal tension the syndesmosis was stressed and found to be stable with regard to medial clear space widening and syndesmotic widening.  The proximal fibular fracture was inspected and found to be acceptably reduced and suitable for closed treatment.  Final fluoroscopic images were obtained.  The wounds were irrigated and the subcuticular tissue was closed with 3-0 Monocryl and the skin with staples.  Xeroform was placed on the wounds as well as 4 x 4's and sterile sheet cotton.  The tourniquet was released.    Patient was  placed in a nonweightbearing short leg splint.  Patient tolerated the procedure well.  There were no complications.  Patient was awakened from anesthesia and taken recovery in stable condition.  POST OPERATIVE INSTRUCTIONS: Nonweightbearing on operative extremity Keep splint dry and limb elevated Continue 325 mg aspirin for DVT prophylaxis Call the office with concerns Follow-up in 2 weeks for splint removal, x-rays of the operative ankle, nonweightbearing and suture removal if appropriate.     TOURNIQUET TIME: Less than 2 hours  BLOOD LOSS:  Minimal         DRAINS: none         SPECIMEN: none       COMPLICATIONS:  * No complications entered in OR log *         Disposition: PACU - hemodynamically stable.         Condition: stable

## 2022-03-26 NOTE — Anesthesia Preprocedure Evaluation (Addendum)
Anesthesia Evaluation  Patient identified by MRN, date of birth, ID band Patient awake    Reviewed: Allergy & Precautions, NPO status , Patient's Chart, lab work & pertinent test results  Airway Mallampati: I  TM Distance: >3 FB Neck ROM: Full    Dental  (+) Missing, Dental Advisory Given,    Pulmonary asthma , Current Smoker and Patient abstained from smoking.   Pulmonary exam normal breath sounds clear to auscultation       Cardiovascular negative cardio ROS Normal cardiovascular exam Rhythm:Regular Rate:Normal     Neuro/Psych  PSYCHIATRIC DISORDERS      negative neurological ROS     GI/Hepatic Neg liver ROS,GERD  Medicated and Controlled,,  Endo/Other    Morbid obesity  Renal/GU negative Renal ROS     Musculoskeletal negative musculoskeletal ROS (+)    Abdominal  (+) + obese  Peds  Hematology negative hematology ROS (+)   Anesthesia Other Findings   Reproductive/Obstetrics negative OB ROS                             Anesthesia Physical Anesthesia Plan  ASA: 3  Anesthesia Plan: General   Post-op Pain Management: Tylenol PO (pre-op)*, Gabapentin PO (pre-op)*, Celebrex PO (pre-op)* and Regional block*   Induction: Intravenous  PONV Risk Score and Plan: 3 and Ondansetron, Dexamethasone, Treatment may vary due to age or medical condition and Midazolam  Airway Management Planned: Oral ETT and LMA  Additional Equipment:   Intra-op Plan:   Post-operative Plan: Extubation in OR  Informed Consent: I have reviewed the patients History and Physical, chart, labs and discussed the procedure including the risks, benefits and alternatives for the proposed anesthesia with the patient or authorized representative who has indicated his/her understanding and acceptance.     Dental advisory given  Plan Discussed with: CRNA  Anesthesia Plan Comments:        Anesthesia Quick  Evaluation

## 2022-03-26 NOTE — Anesthesia Procedure Notes (Signed)
Anesthesia Regional Block: Popliteal block   Pre-Anesthetic Checklist: , timeout performed,  Correct Patient, Correct Site, Correct Laterality,  Correct Procedure, Correct Position, site marked,  Risks and benefits discussed,  Surgical consent,  Pre-op evaluation,  At surgeon's request and post-op pain management  Laterality: Lower and Left  Prep: chloraprep       Needles:  Injection technique: Single-shot  Needle Type: Stimiplex     Needle Length: 10cm  Needle Gauge: 21     Additional Needles:   Procedures:,,,, ultrasound used (permanent image in chart),,   Motor weakness within 5 minutes.  Narrative:  Start time: 03/26/2022 9:35 AM End time: 03/26/2022 9:39 AM Injection made incrementally with aspirations every 5 mL.  Performed by: Personally  Anesthesiologist: Nolon Nations, MD  Additional Notes: Nerve located and needle positioned with direct ultrasound guidance. Good perineural spread. Patient tolerated well.

## 2022-03-26 NOTE — Discharge Instructions (Addendum)
DR. [facility] POST-OP INSTRUCTIONS   Pain Management The numbing medicine and your leg will last around 18 hours, take a dose of your pain medicine as soon as you feel it wearing off to avoid rebound pain. Keep your foot elevated above heart level.  Make sure that your heel hangs free ('floats'). Take all prescribed medication as directed. If taking narcotic pain medication you may want to use an over-the-counter stool softener to avoid constipation. You may take over-the-counter NSAIDs (ibuprofen, naproxen, etc.) as well as over-the-counter acetaminophen as directed on the packaging as a supplement for your pain and may also use it to wean away from the prescription medication.  Activity Non-weightbearing Keep splint intact  First Postoperative Visit Your first postop visit will be at least 2 weeks after surgery.  This should be scheduled when you schedule surgery. If you do not have a postoperative visit scheduled please call [phone] to schedule an appointment. At the appointment your incision will be evaluated for suture removal, x-rays will be obtained if necessary.  General Instructions Swelling is very common after foot and ankle surgery.  It often takes 3 months for the foot and ankle to begin to feel comfortable.  Some amount of swelling will persist for 6-12 months. DO NOT change the dressing.  If there is a problem with the dressing (too tight, loose, gets wet, etc.) please contact Dr. Pollie Friar office. DO NOT get the dressing wet.  For showers you can use an over-the-counter cast cover or wrap a washcloth around the top of your dressing and then cover it with a plastic bag and tape it to your leg. DO NOT soak the incision (no tubs, pools, bath, etc.) until you have approval from Dr. Lucia Gaskins.  Contact Dr. Huel Cote office or go to Emergency Room if: Temperature above 101 F. Increasing pain that is unresponsive to pain medication or elevation Excessive redness or  swelling in your foot Dressing problems - excessive bloody drainage, looseness or tightness, or if dressing gets wet Develop pain, swelling, warmth, or discoloration of your calf   Post Anesthesia Home Care Instructions  Activity: Get plenty of rest for the remainder of the day. A responsible individual must stay with you for 24 hours following the procedure.  For the next 24 hours, DO NOT: -Drive a car -Paediatric nurse -Drink alcoholic beverages -Take any medication unless instructed by your physician -Make any legal decisions or sign important papers.  Meals: Start with liquid foods such as gelatin or soup. Progress to regular foods as tolerated. Avoid greasy, spicy, heavy foods. If nausea and/or vomiting occur, drink only clear liquids until the nausea and/or vomiting subsides. Call your physician if vomiting continues.  Special Instructions/Symptoms: Your throat may feel dry or sore from the anesthesia or the breathing tube placed in your throat during surgery. If this causes discomfort, gargle with warm salt water. The discomfort should disappear within 24 hours.  Regional Anesthesia Blocks  1. Numbness or the inability to move the "blocked" extremity may last from 3-48 hours after placement. The length of time depends on the medication injected and your individual response to the medication. If the numbness is not going away after 48 hours, call your surgeon.  2. The extremity that is blocked will need to be protected until the numbness is gone and the  Strength has returned. Because you cannot feel it, you will need to take extra care to avoid injury. Because it may be weak, you may have  difficulty moving it or using it. You may not know what position it is in without looking at it while the block is in effect.  3. For blocks in the legs and feet, returning to weight bearing and walking needs to be done carefully. You will need to wait until the numbness is entirely gone and the  strength has returned. You should be able to move your leg and foot normally before you try and bear weight or walk. You will need someone to be with you when you first try to ensure you do not fall and possibly risk injury.  4. Bruising and tenderness at the needle site are common side effects and will resolve in a few days.  5. Persistent numbness or new problems with movement should be communicated to the surgeon or the [facility] [phone] [place] [phone]).   No tylenol until 2:43 pm No ibuprofen until 4:43 pm

## 2022-03-27 ENCOUNTER — Encounter (HOSPITAL_BASED_OUTPATIENT_CLINIC_OR_DEPARTMENT_OTHER): Payer: Self-pay | Admitting: Orthopaedic Surgery

## 2022-04-02 ENCOUNTER — Telehealth: Payer: Self-pay | Admitting: Orthopaedic Surgery

## 2022-04-02 NOTE — Telephone Encounter (Signed)
Patient asking for pain medication please advise 2nd call

## 2022-04-02 NOTE — Telephone Encounter (Signed)
Patient's mom Amanda Gray called stating patient is in a lot of pain and that the pain medication is not helping.  She stated she is using tylenol in between pain meds. I advised elevating the ankle at this time.

## 2022-04-03 NOTE — Telephone Encounter (Signed)
noted 

## 2022-04-05 ENCOUNTER — Encounter (HOSPITAL_BASED_OUTPATIENT_CLINIC_OR_DEPARTMENT_OTHER): Payer: Medicaid Other | Admitting: Orthopaedic Surgery

## 2022-10-08 ENCOUNTER — Ambulatory Visit (HOSPITAL_BASED_OUTPATIENT_CLINIC_OR_DEPARTMENT_OTHER): Payer: Medicaid Other | Admitting: Student

## 2022-10-10 ENCOUNTER — Ambulatory Visit (HOSPITAL_BASED_OUTPATIENT_CLINIC_OR_DEPARTMENT_OTHER): Payer: MEDICAID | Admitting: Student

## 2022-10-22 ENCOUNTER — Ambulatory Visit (HOSPITAL_BASED_OUTPATIENT_CLINIC_OR_DEPARTMENT_OTHER): Payer: Medicaid Other | Admitting: Student

## 2023-03-26 ENCOUNTER — Ambulatory Visit: Payer: Self-pay | Admitting: Nurse Practitioner

## 2023-05-19 ENCOUNTER — Encounter: Payer: Self-pay | Admitting: Nurse Practitioner

## 2023-05-19 ENCOUNTER — Ambulatory Visit (INDEPENDENT_AMBULATORY_CARE_PROVIDER_SITE_OTHER): Payer: MEDICAID | Admitting: Nurse Practitioner

## 2023-05-19 VITALS — BP 132/82 | HR 71 | Temp 97.0°F | Wt 230.0 lb

## 2023-05-19 DIAGNOSIS — J302 Other seasonal allergic rhinitis: Secondary | ICD-10-CM

## 2023-05-19 DIAGNOSIS — R21 Rash and other nonspecific skin eruption: Secondary | ICD-10-CM

## 2023-05-19 DIAGNOSIS — F1210 Cannabis abuse, uncomplicated: Secondary | ICD-10-CM | POA: Diagnosis not present

## 2023-05-19 DIAGNOSIS — Z720 Tobacco use: Secondary | ICD-10-CM

## 2023-05-19 DIAGNOSIS — F17210 Nicotine dependence, cigarettes, uncomplicated: Secondary | ICD-10-CM | POA: Diagnosis not present

## 2023-05-19 DIAGNOSIS — Z113 Encounter for screening for infections with a predominantly sexual mode of transmission: Secondary | ICD-10-CM | POA: Insufficient documentation

## 2023-05-19 DIAGNOSIS — J4520 Mild intermittent asthma, uncomplicated: Secondary | ICD-10-CM

## 2023-05-19 DIAGNOSIS — F1910 Other psychoactive substance abuse, uncomplicated: Secondary | ICD-10-CM

## 2023-05-19 DIAGNOSIS — K219 Gastro-esophageal reflux disease without esophagitis: Secondary | ICD-10-CM

## 2023-05-19 MED ORDER — PANTOPRAZOLE SODIUM 20 MG PO TBEC: 20.0000 mg | DELAYED_RELEASE_TABLET | Freq: Two times a day (BID) | ORAL | 1 refills | Status: DC

## 2023-05-19 MED ORDER — ALBUTEROL SULFATE HFA 108 (90 BASE) MCG/ACT IN AERS: 2.0000 | INHALATION_SPRAY | Freq: Four times a day (QID) | RESPIRATORY_TRACT | 0 refills | Status: DC | PRN

## 2023-05-19 MED ORDER — TRIAMCINOLONE ACETONIDE 0.1 % EX CREA: 1.0000 | TOPICAL_CREAM | Freq: Two times a day (BID) | CUTANEOUS | 0 refills | Status: DC

## 2023-05-19 NOTE — Assessment & Plan Note (Signed)
 Would like to have STD testing done  - HepB+HepC+HIV Panel; Future - RPR; Future - NuSwab Vaginitis Plus (VG+); Future

## 2023-05-19 NOTE — Assessment & Plan Note (Signed)
 Smokes about your 1 pack/day  Asked about quitting: confirms that he/she currently smokes cigarettes Advise to quit smoking: Educated about QUITTING to reduce the risk of cancer, cardio and cerebrovascular disease. Assess willingness: Unwilling to quit at this time, not working on cutting back. Assist with counseling and pharmacotherapy: Counseled for 5 minutes and literature provided. Arrange for follow up: follow up in 1 month and continue to offer help.

## 2023-05-19 NOTE — Assessment & Plan Note (Signed)
 Need to avoid use of illicit drugs discussed

## 2023-05-19 NOTE — Assessment & Plan Note (Signed)
 Uses albuterol  inhaler as needed Medication refilled Smoking cessation encouraged - albuterol  (VENTOLIN  HFA) 108 (90 Base) MCG/ACT inhaler; Inhale 2 puffs into the lungs every 6 (six) hours as needed for wheezing.  Dispense: 1 each; Refill: 0

## 2023-05-19 NOTE — Assessment & Plan Note (Signed)
 triamcinolone  cream (KENALOG ) 0.1 %; Apply 1 Application topically 2 (two) times daily for 1 to 2 weeks and then as needed.

## 2023-05-19 NOTE — Assessment & Plan Note (Signed)
 Pantoprazole  20 mg twice daily refilled Avoid fatty fried food, spicy food, alcohol, caffeinated drinks and other food that triggers acid reflux symptoms, avoid eating within 2 to 3 hours before  bedtime.

## 2023-05-19 NOTE — Patient Instructions (Signed)
 1. Gastroesophageal reflux disease without esophagitis  - pantoprazole  (PROTONIX ) 20 MG tablet; Take 1 tablet (20 mg total) by mouth 2 (two) times daily before a meal.  Dispense: 60 tablet; Refill: 1  2. Mild intermittent asthma without complication (Primary)  - albuterol  (VENTOLIN  HFA) 108 (90 Base) MCG/ACT inhaler; Inhale 2 puffs into the lungs every 6 (six) hours as needed for wheezing.  Dispense: 1 each; Refill: 0  3. Marijuana abuse   4. Tobacco abuse   5. Seasonal allergies   6. Screening examination for STI  - NuSwab Vaginitis Plus (VG+) - HepB+HepC+HIV Panel; Future - RPR; Future    It is important that you exercise regularly at least 30 minutes 5 times a week as tolerated  Think about what you will eat, plan ahead. Choose " clean, green, fresh or frozen" over canned, processed or packaged foods which are more sugary, salty and fatty. 70 to 75% of food eaten should be vegetables and fruit. Three meals at set times with snacks allowed between meals, but they must be fruit or vegetables. Aim to eat over a 12 hour period , example 7 am to 7 pm, and STOP after  your last meal of the day. Drink water,generally about 64 ounces per day, no other drink is as healthy. Fruit juice is best enjoyed in a healthy way, by EATING the fruit.  Thanks for choosing [facility] we consider it a privelige to serve you.

## 2023-05-19 NOTE — Progress Notes (Signed)
 New Patient Office Visit  Subjective:  Patient ID: Amanda Gray, female    DOB: November 21, 1985  Age: 38 y.o. MRN: [id number]  CC:  Chief Complaint  Patient presents with   Establish Care    HPI Amanda Gray is a 38 y.o. female  has a past medical history of Acid reflux, Asthma, Back pain, Cocaine abuse (HCC) (12/04/2021), Marijuana abuse (12/04/2021), and Seasonal allergies.  Patient presents to establish care for her chronic medical conditions.  Has no previous PCP.  Tobacco use disorder.  Smokes 1 pack of cigarettes daily.  Started smoking at age 46.  Currently denies cough, shortness of breath, wheezing  Polysubstance abuse.  Has used marijuana,,Cocaine and Ecstasy.  Drug was last used 2 months ago.  Stated that she has been to rehab for drugs in the past.  Rashes.  Patient complains of itchy rashes on her right wrist first noticed 2 weeks ago. No known irritant exposure. Stated that she has had this rashes on other part of her body before.  Has Used steroid cream in the past    Past Medical History:  Diagnosis Date   Acid reflux    Asthma    Back pain    Cocaine abuse (HCC) 12/04/2021   Marijuana abuse 12/04/2021   Seasonal allergies     Past Surgical History:  Procedure Laterality Date   ANKLE ARTHROSCOPY WITH OPEN REDUCTION INTERNAL FIXATION (ORIF) Left 03/26/2022   Procedure: LEFT ANKLE ARTHROSCOPY, OPEN TREATMENT OF BIMALLEOLAR ANKLE FRACTURE, CLOSED TREATMENT OF FIBULAR SHAFT FRACTURE;  Surgeon: Donnamarie Gables, MD;  Location: [facility];  Service: Orthopedics;  Laterality: Left;  LENGTH OF SURGERY: 120 MINUTES   CESAREAN SECTION     CESAREAN SECTION     SYNDESMOSIS REPAIR Left 03/26/2022   Procedure: SYNDESMOSIS REPAIR;  Surgeon: Donnamarie Gables, MD;  Location: [facility];  Service: Orthopedics;  Laterality: Left;    Family History  Problem Relation Age of Onset   Diabetes Mother    Heart disease Mother    Diabetes Father     High blood pressure Father    Heart disease Father    Kidney cancer Brother    GER disease Brother    High blood pressure Brother    Lung cancer Maternal Uncle    Leukemia Paternal Aunt    Lupus Paternal Aunt    Leukemia Maternal Grandmother     Social History   Socioeconomic History   Marital status: Single    Spouse name: Not on file   Number of children: 1   Years of education: Not on file   Highest education level: Not on file  Occupational History   Not on file  Tobacco Use   Smoking status: Every Day    Current packs/day: 1.00    Types: Cigarettes   Smokeless tobacco: Never  Vaping Use   Vaping status: Never Used  Substance and Sexual Activity   Alcohol use: No   Drug use: Not Currently    Types: Marijuana, Cocaine, Other-see comments    Comment: Ecstasy last used 2 months ago   Sexual activity: Yes  Other Topics Concern   Not on file  Social History Narrative   Lives with her mother and son    Social Drivers of Corporate investment banker Strain: Not on file  Food Insecurity: Not on file  Transportation Needs: Not on file  Physical Activity: Not on file  Stress: Not on file  Social Connections: Not on file  Intimate Partner Violence: Not on file    ROS Review of Systems  Constitutional:  Negative for appetite change, chills, fatigue and fever.  HENT:  Negative for congestion, postnasal drip, rhinorrhea and sneezing.   Respiratory:  Negative for cough, shortness of breath and wheezing.   Cardiovascular:  Negative for chest pain, palpitations and leg swelling.  Gastrointestinal:  Negative for abdominal pain, constipation, nausea and vomiting.  Genitourinary:  Negative for difficulty urinating, dysuria, flank pain, frequency and genital sores.  Musculoskeletal:  Negative for arthralgias, back pain, joint swelling and myalgias.  Skin:  Positive for rash. Negative for color change, pallor and wound.  Neurological:  Negative for dizziness, facial  asymmetry, weakness, numbness and headaches.  Psychiatric/Behavioral:  Negative for behavioral problems, confusion, self-injury and suicidal ideas.     Objective:   Today's Vitals: BP 132/82   Pulse 71   Temp (!) 97 F (36.1 C)   Wt 230 lb (104.3 kg)   SpO2 100%   BMI 40.74 kg/m   Physical Exam Vitals and nursing note reviewed.  Constitutional:      General: She is not in acute distress.    Appearance: Normal appearance. She is obese. She is not ill-appearing, toxic-appearing or diaphoretic.  HENT:     Mouth/Throat:     Mouth: Mucous membranes are moist.     Pharynx: Oropharynx is clear. No oropharyngeal exudate or posterior oropharyngeal erythema.  Eyes:     General: No scleral icterus.       Right eye: No discharge.        Left eye: No discharge.     Extraocular Movements: Extraocular movements intact.     Conjunctiva/sclera: Conjunctivae normal.  Cardiovascular:     Rate and Rhythm: Normal rate and regular rhythm.     Pulses: Normal pulses.     Heart sounds: Normal heart sounds. No murmur heard.    No friction rub. No gallop.  Pulmonary:     Effort: Pulmonary effort is normal. No respiratory distress.     Breath sounds: Normal breath sounds. No stridor. No wheezing, rhonchi or rales.  Chest:     Chest wall: No tenderness.  Abdominal:     General: There is no distension.     Palpations: Abdomen is soft.     Tenderness: There is no abdominal tenderness. There is no right CVA tenderness, left CVA tenderness or guarding.  Musculoskeletal:        General: No swelling, tenderness, deformity or signs of injury.     Right lower leg: No edema.     Left lower leg: No edema.  Skin:    General: Skin is warm and dry.     Capillary Refill: Capillary refill takes less than 2 seconds.     Coloration: Skin is not jaundiced or pale.     Findings: Rash present. No bruising, erythema or lesion.     Comments: Erythematous rashes noted on right wrist.  No swelling, discharge noted   Neurological:     Mental Status: She is alert and oriented to person, place, and time.     Motor: No weakness.     Coordination: Coordination normal.     Gait: Gait normal.  Psychiatric:        Mood and Affect: Mood normal.        Behavior: Behavior normal.        Thought Content: Thought content normal.        Judgment:  Judgment normal.     Assessment & Plan:   Problem List Items Addressed This Visit       Respiratory   Mild intermittent asthma without complication - Primary   Uses albuterol  inhaler as needed Medication refilled Smoking cessation encouraged - albuterol  (VENTOLIN  HFA) 108 (90 Base) MCG/ACT inhaler; Inhale 2 puffs into the lungs every 6 (six) hours as needed for wheezing.  Dispense: 1 each; Refill: 0       Relevant Medications   albuterol  (VENTOLIN  HFA) 108 (90 Base) MCG/ACT inhaler     Digestive   Gastroesophageal reflux disease without esophagitis   Pantoprazole  20 mg twice daily refilled Avoid fatty fried food, spicy food, alcohol, caffeinated drinks and other food that triggers acid reflux symptoms, avoid eating within 2 to 3 hours before  bedtime.       Relevant Medications   pantoprazole  (PROTONIX ) 20 MG tablet     Musculoskeletal and Integument   Rash and other nonspecific skin eruption    triamcinolone  cream (KENALOG ) 0.1 %; Apply 1 Application topically 2 (two) times daily for 1 to 2 weeks and then as needed.       Relevant Medications   triamcinolone  cream (KENALOG ) 0.1 %     Other   Marijuana abuse   Tobacco abuse   Smokes about your 1 pack/day  Asked about quitting: confirms that he/she currently smokes cigarettes Advise to quit smoking: Educated about QUITTING to reduce the risk of cancer, cardio and cerebrovascular disease. Assess willingness: Unwilling to quit at this time, not working on cutting back. Assist with counseling and pharmacotherapy: Counseled for 5 minutes and literature provided. Arrange for follow up: follow up  in 1 month and continue to offer help.       Seasonal allergies   Polysubstance abuse (HCC)   Need to avoid use of illicit drugs discussed      Screening examination for STI   Would like to have STD testing done  - HepB+HepC+HIV Panel; Future - RPR; Future - NuSwab Vaginitis Plus (VG+); Future       Relevant Orders   HepB+HepC+HIV Panel   RPR   NuSwab Vaginitis Plus (VG+)    Outpatient Encounter Medications as of 05/19/2023  Medication Sig   acetaminophen  (TYLENOL ) 325 MG tablet Take 2 tablets (650 mg total) by mouth every 6 (six) hours as needed.   triamcinolone  cream (KENALOG ) 0.1 % Apply 1 Application topically 2 (two) times daily.   [DISCONTINUED] albuterol  (VENTOLIN  HFA) 108 (90 Base) MCG/ACT inhaler Inhale 1-2 puffs into the lungs every 4 (four) hours as needed for wheezing.   albuterol  (VENTOLIN  HFA) 108 (90 Base) MCG/ACT inhaler Inhale 2 puffs into the lungs every 6 (six) hours as needed for wheezing.   aspirin  EC 81 MG tablet Take 1 tablet by mouth twice daily for 30 days after surgery for blood clot prevention. Swallow whole. (Patient not taking: Reported on 05/19/2023)   ibuprofen  (ADVIL ) 600 MG tablet Take 1 tablet (600 mg total) by mouth every 6 (six) hours as needed. (Patient not taking: Reported on 05/19/2023)   Multiple Vitamin (MULTIVITAMIN WITH MINERALS) TABS tablet Take 1 tablet by mouth daily. (Patient not taking: Reported on 05/19/2023)   nicotine  (NICODERM CQ  - DOSED IN MG/24 HOURS) 21 mg/24hr patch Place 1 patch (21 mg total) onto the skin daily. (Patient not taking: Reported on 05/19/2023)   pantoprazole  (PROTONIX ) 20 MG tablet Take 1 tablet (20 mg total) by mouth 2 (two) times daily before a meal.  thiamine  (VITAMIN B-1) 100 MG tablet Take 1 tablet (100 mg total) by mouth daily. (Patient not taking: Reported on 05/19/2023)   traZODone  (DESYREL ) 50 MG tablet Take 1 - 2 tabs PO QHS PRN (Patient not taking: Reported on 05/19/2023)   [DISCONTINUED] oxyCODONE   (ROXICODONE ) 5 MG immediate release tablet Take 1 tablet (5 mg total) by mouth every 4 (four) hours as needed for severe pain. (Patient not taking: Reported on 05/19/2023)   [DISCONTINUED] oxyCODONE  (ROXICODONE ) 5 MG immediate release tablet Take 1 tablet (5 mg total) by mouth every 4 (four) hours as needed for severe pain. (Patient not taking: Reported on 05/19/2023)   [DISCONTINUED] pantoprazole  (PROTONIX ) 20 MG tablet Take 1 tablet (20 mg total) by mouth 2 (two) times daily before a meal. (Patient not taking: Reported on 05/19/2023)   No facility-administered encounter medications on file as of 05/19/2023.    Follow-up: Return in about 4 weeks (around 06/16/2023) for CPE.   Deandrea Rion R Jeoffrey Eleazer, FNP

## 2023-05-20 ENCOUNTER — Other Ambulatory Visit: Payer: MEDICAID

## 2023-05-20 DIAGNOSIS — Z113 Encounter for screening for infections with a predominantly sexual mode of transmission: Secondary | ICD-10-CM

## 2023-05-22 LAB — HEPB+HEPC+HIV PANEL
Hep B C IgM: NEGATIVE
Hep B Core Total Ab: NEGATIVE
Hep B E Ab: NONREACTIVE
Hep B E Ag: NEGATIVE
Hep B Surface Ab, Qual: REACTIVE
Hep C Virus Ab: NONREACTIVE
Hepatitis B Surface Ag: NEGATIVE

## 2023-05-22 LAB — NUSWAB VAGINITIS PLUS (VG+)
Chlamydia trachomatis, NAA: NEGATIVE
Neisseria gonorrhoeae, NAA: NEGATIVE

## 2023-05-22 LAB — RPR: RPR Ser Ql: NONREACTIVE

## 2023-05-23 ENCOUNTER — Other Ambulatory Visit: Payer: Self-pay | Admitting: Nurse Practitioner

## 2023-05-23 DIAGNOSIS — A5901 Trichomonal vulvovaginitis: Secondary | ICD-10-CM

## 2023-05-23 MED ORDER — METRONIDAZOLE 500 MG PO TABS: 500.0000 mg | ORAL_TABLET | Freq: Two times a day (BID) | ORAL | 0 refills | Status: DC

## 2023-05-28 ENCOUNTER — Other Ambulatory Visit (HOSPITAL_COMMUNITY): Payer: Self-pay

## 2023-05-28 ENCOUNTER — Other Ambulatory Visit: Payer: Self-pay

## 2023-05-28 DIAGNOSIS — R21 Rash and other nonspecific skin eruption: Secondary | ICD-10-CM

## 2023-05-28 DIAGNOSIS — K219 Gastro-esophageal reflux disease without esophagitis: Secondary | ICD-10-CM

## 2023-05-28 DIAGNOSIS — J4520 Mild intermittent asthma, uncomplicated: Secondary | ICD-10-CM

## 2023-05-28 DIAGNOSIS — A5901 Trichomonal vulvovaginitis: Secondary | ICD-10-CM

## 2023-05-28 MED ORDER — METRONIDAZOLE 500 MG PO TABS
500.0000 mg | ORAL_TABLET | Freq: Two times a day (BID) | ORAL | 0 refills | Status: DC
  Filled 2023-05-28: qty 14, 7d supply, fill #0

## 2023-05-28 MED ORDER — ALBUTEROL SULFATE HFA 108 (90 BASE) MCG/ACT IN AERS
2.0000 | INHALATION_SPRAY | Freq: Four times a day (QID) | RESPIRATORY_TRACT | 0 refills | Status: AC | PRN
  Filled 2023-05-28: qty 18, 25d supply, fill #0

## 2023-05-28 MED ORDER — PANTOPRAZOLE SODIUM 20 MG PO TBEC
20.0000 mg | DELAYED_RELEASE_TABLET | Freq: Two times a day (BID) | ORAL | 1 refills | Status: DC
  Filled 2023-05-28: qty 60, 30d supply, fill #0

## 2023-05-28 MED ORDER — TRIAMCINOLONE ACETONIDE 0.1 % EX CREA
1.0000 | TOPICAL_CREAM | Freq: Two times a day (BID) | CUTANEOUS | 0 refills | Status: DC
  Filled 2023-05-28: qty 30, 15d supply, fill #0

## 2023-05-29 ENCOUNTER — Other Ambulatory Visit: Payer: Self-pay

## 2023-05-29 ENCOUNTER — Other Ambulatory Visit (HOSPITAL_COMMUNITY): Payer: Self-pay

## 2023-05-29 DIAGNOSIS — A5901 Trichomonal vulvovaginitis: Secondary | ICD-10-CM

## 2023-05-29 DIAGNOSIS — R21 Rash and other nonspecific skin eruption: Secondary | ICD-10-CM

## 2023-05-29 DIAGNOSIS — K219 Gastro-esophageal reflux disease without esophagitis: Secondary | ICD-10-CM

## 2023-05-29 MED ORDER — METRONIDAZOLE 500 MG PO TABS
500.0000 mg | ORAL_TABLET | Freq: Two times a day (BID) | ORAL | 0 refills | Status: AC
  Filled 2023-05-29 – 2023-06-02 (×2): qty 14, 7d supply, fill #0

## 2023-05-29 MED ORDER — TRIAMCINOLONE ACETONIDE 0.1 % EX CREA
1.0000 | TOPICAL_CREAM | Freq: Two times a day (BID) | CUTANEOUS | 0 refills | Status: AC
  Filled 2023-05-29 – 2023-06-02 (×2): qty 30, 15d supply, fill #0

## 2023-05-29 MED ORDER — PANTOPRAZOLE SODIUM 20 MG PO TBEC
20.0000 mg | DELAYED_RELEASE_TABLET | Freq: Two times a day (BID) | ORAL | 1 refills | Status: DC
  Filled 2023-05-29 – 2023-06-02 (×2): qty 60, 30d supply, fill #0

## 2023-05-30 ENCOUNTER — Other Ambulatory Visit (HOSPITAL_COMMUNITY): Payer: Self-pay

## 2023-06-02 ENCOUNTER — Other Ambulatory Visit (HOSPITAL_COMMUNITY): Payer: Self-pay

## 2023-06-02 ENCOUNTER — Other Ambulatory Visit: Payer: Self-pay | Admitting: Nurse Practitioner

## 2023-06-02 DIAGNOSIS — A5901 Trichomonal vulvovaginitis: Secondary | ICD-10-CM

## 2023-06-02 DIAGNOSIS — R21 Rash and other nonspecific skin eruption: Secondary | ICD-10-CM

## 2023-06-02 DIAGNOSIS — K219 Gastro-esophageal reflux disease without esophagitis: Secondary | ICD-10-CM

## 2023-06-02 NOTE — Telephone Encounter (Signed)
 Copied from CRM [id number]. Topic: Clinical - Medication Refill >> Jun 02, 2023  9:30 AM Ovid Blow wrote: Most Recent Primary Care Visit:  Provider: [facility]  Department[facility]  Visit Type: LAB  Date: 05/20/2023  Medication: pantoprazole  (PROTONIX ) 20 MG tablet triamcinolone  cream (KENALOG ) 0.1 % metroNIDAZOLE  (FLAGYL ) 500 MG tablet  Has the patient contacted their pharmacy? Yes (Agent: If no, request that the patient contact the pharmacy for the refill. If patient does not wish to contact the pharmacy document the reason why and proceed with request.) (Agent: If yes, when and what did the pharmacy advise?)  Is this the correct pharmacy for this prescription? Yes If no, delete pharmacy and type the correct one.  This is the patient's preferred pharmacy:  [facility] [address] [place] [address] Phone: [phone] Fax: [phone]  Has the prescription been filled recently? No  Is the patient out of the medication? Yes  Has the patient been seen for an appointment in the last year OR does the patient have an upcoming appointment? Yes  Can we respond through MyChart? Yes  Agent: Please be advised that Rx refills may take up to 3 business days. We ask that you follow-up with your pharmacy.

## 2023-06-12 ENCOUNTER — Other Ambulatory Visit (HOSPITAL_COMMUNITY): Payer: Self-pay

## 2023-06-20 ENCOUNTER — Ambulatory Visit: Payer: Self-pay | Admitting: Nurse Practitioner

## 2023-07-29 ENCOUNTER — Ambulatory Visit: Payer: Self-pay | Admitting: Nurse Practitioner

## 2023-08-27 ENCOUNTER — Encounter (HOSPITAL_COMMUNITY): Payer: Self-pay | Admitting: Emergency Medicine

## 2023-08-27 ENCOUNTER — Emergency Department (HOSPITAL_COMMUNITY)
Admission: EM | Admit: 2023-08-27 | Discharge: 2023-08-27 | Disposition: A | Discharge status: Home / Self Care | Payer: MEDICAID

## 2023-08-27 ENCOUNTER — Other Ambulatory Visit: Payer: Self-pay

## 2023-08-27 DIAGNOSIS — L0390 Cellulitis, unspecified: Secondary | ICD-10-CM

## 2023-08-27 DIAGNOSIS — N760 Acute vaginitis: Secondary | ICD-10-CM

## 2023-08-27 DIAGNOSIS — L309 Dermatitis, unspecified: Secondary | ICD-10-CM

## 2023-08-27 DIAGNOSIS — B9689 Other specified bacterial agents as the cause of diseases classified elsewhere: Secondary | ICD-10-CM | POA: Insufficient documentation

## 2023-08-27 DIAGNOSIS — N732 Unspecified parametritis and pelvic cellulitis: Secondary | ICD-10-CM | POA: Diagnosis not present

## 2023-08-27 DIAGNOSIS — R21 Rash and other nonspecific skin eruption: Secondary | ICD-10-CM | POA: Diagnosis present

## 2023-08-27 LAB — URINALYSIS, ROUTINE W REFLEX MICROSCOPIC
Bilirubin Urine: NEGATIVE
Glucose, UA: NEGATIVE mg/dL
Ketones, ur: NEGATIVE mg/dL
Nitrite: NEGATIVE
Protein, ur: 30 mg/dL — AB
Specific Gravity, Urine: 1.021 (ref 1.005–1.030)
pH: 6 (ref 5.0–8.0)

## 2023-08-27 LAB — WET PREP, GENITAL
Sperm: NONE SEEN
Trich, Wet Prep: NONE SEEN
WBC, Wet Prep HPF POC: <10 (ref <10)
Yeast Wet Prep HPF POC: NONE SEEN

## 2023-08-27 LAB — RAPID HIV SCREEN (HIV 1/2 AB+AG)
HIV 1/2 Antibodies: NONREACTIVE
HIV-1 P24 Antigen - HIV24: NONREACTIVE

## 2023-08-27 LAB — PREGNANCY, URINE: Preg Test, Ur: NEGATIVE

## 2023-08-27 MED ORDER — DOXYCYCLINE HYCLATE 100 MG PO TABS
100.0000 mg | ORAL_TABLET | Freq: Once | ORAL | Status: AC
  Administered 2023-08-27: 100 mg via ORAL
  Filled 2023-08-27: qty 1

## 2023-08-27 MED ORDER — METRONIDAZOLE 500 MG PO TABS: 500.0000 mg | ORAL_TABLET | Freq: Two times a day (BID) | ORAL | 0 refills | Status: AC

## 2023-08-27 MED ORDER — CLOTRIMAZOLE-BETAMETHASONE 1-0.05 % EX CREA: 1.0000 | TOPICAL_CREAM | Freq: Two times a day (BID) | CUTANEOUS | 0 refills | Status: DC

## 2023-08-27 MED ORDER — CEFTRIAXONE SODIUM 1 G IJ SOLR
500.0000 mg | Freq: Once | INTRAMUSCULAR | Status: AC
  Administered 2023-08-27: 500 mg via INTRAMUSCULAR
  Filled 2023-08-27: qty 10

## 2023-08-27 MED ORDER — STERILE WATER FOR INJECTION IJ SOLN
INTRAMUSCULAR | Status: AC
  Administered 2023-08-27: 2.1 mL
  Filled 2023-08-27: qty 10

## 2023-08-27 MED ORDER — METRONIDAZOLE 500 MG PO TABS
500.0000 mg | ORAL_TABLET | Freq: Once | ORAL | Status: AC
  Administered 2023-08-27: 500 mg via ORAL
  Filled 2023-08-27: qty 1

## 2023-08-27 MED ORDER — DOXYCYCLINE HYCLATE 100 MG PO CAPS: 100.0000 mg | ORAL_CAPSULE | Freq: Two times a day (BID) | ORAL | 0 refills | Status: AC
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
## 2023-08-27 NOTE — Discharge Instructions (Addendum)
 Your swabs show that you have bacterial vaginosis which is a nonsexually transmitted infection.  Please take the Flagyl  and doxycycline .  Try the new cream which covers for fungal infections as well as inflammatory issues.  Please call the dermatologist at the number provided to schedule a follow-up appointment.  Please return to the emergency department for worsening symptoms.

## 2023-08-27 NOTE — ED Provider Notes (Signed)
 [place] EMERGENCY DEPARTMENT AT [facility] Provider Note   CSN: [id number] Arrival date & time: 08/27/23  1240     Patient presents with: Rash and Exposure to STD   Amanda Gray is a 38 y.o. female.   38 year old female presents emergency department today with rash over her right arm.  The patient states that this has been going on now for the past 2 months.  She states that she has been on some what sounds like steroid cream from her primary care provider and is continued to worsen.  She has been scratching at the area and has noticed some erythema since then.  She is also requesting testing for sexually transmitted infection.  She states that she is having some mild dysuria as well as some whitish vaginal discharge.  She denies any associated nausea or vomiting.  Denies any fevers.  Denies any severe abdominal pain.   Rash Exposure to STD       Prior to Admission medications   Medication Sig Start Date End Date Taking? Authorizing Provider  clotrimazole -betamethasone  (LOTRISONE ) cream Apply 1 Application topically 2 (two) times daily. 08/27/23  Yes Ula Prentice SAUNDERS, MD  doxycycline  (VIBRAMYCIN ) 100 MG capsule Take 1 capsule (100 mg total) by mouth 2 (two) times daily. 08/27/23  Yes Ula Prentice SAUNDERS, MD  metroNIDAZOLE  (FLAGYL ) 500 MG tablet Take 1 tablet (500 mg total) by mouth 2 (two) times daily. 08/27/23  Yes Ula Prentice SAUNDERS, MD    Allergies: Patient has no allergy information on record.    Review of Systems  Genitourinary:  Positive for vaginal discharge.  Skin:  Positive for rash.  All other systems reviewed and are negative.   Updated Vital Signs BP (!) 146/115 (BP Location: Right Arm)   Pulse 74   Temp 97.9 F (36.6 C) (Oral)   Resp 17   Ht 5' 3 (1.6 m)   Wt 99.3 kg   LMP 07/29/2023   SpO2 100%   BMI 38.79 kg/m   Physical Exam Vitals and nursing note reviewed.   Gen: NAD Eyes: PERRL, EOMI HEENT: no oropharyngeal swelling Neck: trachea  midline Resp: clear to auscultation bilaterally Card: RRR, no murmurs, rubs, or gallops Abd: nontender, nondistended Extremities: no calf tenderness, no edema Vascular: 2+ radial pulses bilaterally, 2+ DP pulses bilaterally Skin: Maculopapular rash noted over the right hand and forearm up to the mid forearm with some signs of excessive corrugation and erythema noted, there is no rash over the palms Psyc: acting appropriately   (all labs ordered are listed, but only abnormal results are displayed) Labs Reviewed  WET PREP, GENITAL - Abnormal; Notable for the following components:      Result Value   Clue Cells Wet Prep HPF POC PRESENT (*)    All other components within normal limits  PREGNANCY, URINE  URINALYSIS, ROUTINE W REFLEX MICROSCOPIC  RPR  RAPID HIV SCREEN (HIV 1/2 AB+AG)  GC/CHLAMYDIA PROBE AMP ([place]) NOT AT [facility]    EKG: None  Radiology: No results found.   Procedures   Medications Ordered in the ED  doxycycline  (VIBRA -TABS) tablet 100 mg (has no administration in time range)  metroNIDAZOLE  (FLAGYL ) tablet 500 mg (has no administration in time range)  cefTRIAXone  (ROCEPHIN ) injection 500 mg (has no administration in time range)  Medical Decision Making 38 year old female presents emergency department today with rash over her right arm as well as some vaginal discharge and dysuria.  These do not seem to be temporally related.  I will give patient doxycycline  for the area of erythema as it does appear that she may have some secondary cellulitis but unclear at this point if this is due to fungal infection versus eczematous process.  Will cover the patient with combination steroid and antifungal cream.  Will give her follow-up with dermatology.  We discussed pelvic exam versus self swabbing and the patient is going to self swab for possible sexually transmitted infections.  Will also obtain an RPR and HIV testing given the rash  although again this does not seem to be temporally related to the discharge.  Suspicion for PID is low with no significant abdominal pain.  The patient's workup was revealing for bacterial vaginosis.  The patient is given Flagyl .  She is given doxycycline  for the cellulitis surrounding her skin rash from the excoriation.  The patient is concerned that she may have been exposed to a sexually transmitted infection and is requesting empiric treatment.  The patient is given a dose of IM Rocephin  for this as well.  I will treat the patient with Lotrisone  as I am unsure if her primary care provider treated her initially with steroids or antifungals as this has continued to worsen.  She will be discharged with dermatology follow-up with return precautions.  Amount and/or Complexity of Data Reviewed Labs: ordered.  Risk Prescription drug management.        Final diagnoses:  Dermatitis  Cellulitis, unspecified cellulitis site  Bacterial vaginosis    ED Discharge Orders          Ordered    doxycycline  (VIBRAMYCIN ) 100 MG capsule  2 times daily        08/27/23 1905    metroNIDAZOLE  (FLAGYL ) 500 MG tablet  2 times daily        08/27/23 1905    clotrimazole -betamethasone  (LOTRISONE ) cream  2 times daily        08/27/23 1905               Ula Prentice SAUNDERS, MD 08/27/23 [phone]

## 2023-08-28 ENCOUNTER — Encounter: Payer: Self-pay | Admitting: Nurse Practitioner

## 2023-08-28 ENCOUNTER — Other Ambulatory Visit (HOSPITAL_COMMUNITY): Payer: Self-pay

## 2023-08-28 LAB — GC/CHLAMYDIA PROBE AMP (~~LOC~~) NOT AT ARMC
Chlamydia: NEGATIVE
Comment: NEGATIVE
Comment: NORMAL
Neisseria Gonorrhea: NEGATIVE

## 2023-08-28 LAB — RPR: RPR Ser Ql: NONREACTIVE

## 2023-08-28 MED ORDER — DOXYCYCLINE HYCLATE 100 MG PO CAPS
100.0000 mg | ORAL_CAPSULE | Freq: Two times a day (BID) | ORAL | 0 refills | Status: AC
  Filled 2023-08-28: qty 20, 10d supply, fill #0

## 2023-08-28 MED ORDER — CLOTRIMAZOLE-BETAMETHASONE 1-0.05 % EX CREA
1.0000 | TOPICAL_CREAM | Freq: Two times a day (BID) | CUTANEOUS | 0 refills | Status: DC
  Filled 2023-08-28: qty 30, 15d supply, fill #0

## 2023-08-28 MED ORDER — METRONIDAZOLE 500 MG PO TABS
500.0000 mg | ORAL_TABLET | Freq: Two times a day (BID) | ORAL | 0 refills | Status: AC
  Filled 2023-08-28: qty 14, 7d supply, fill #0

## 2023-09-02 ENCOUNTER — Telehealth: Payer: Self-pay

## 2023-09-02 NOTE — Transitions of Care (Post Inpatient/ED Visit) (Signed)
 09/02/2023  Name: Amanda Gray MRN: [id number] DOB: 1985/08/24  Today's TOC FU Call Status:   Patient's Name and Date of Birth confirmed.  Transition Care Management Follow-up Telephone Call Date of Discharge: 08/27/23 Discharge Facility: [facility]) Type of Discharge: Emergency Department Reason for ED Visit: Other: How have you been since you were released from the hospital?: Better Any questions or concerns?: No  Items Reviewed: Did you receive and understand the discharge instructions provided?: Yes Medications obtained,verified, and reconciled?: Yes (Medications Reviewed) Any new allergies since your discharge?: No Dietary orders reviewed?: NA Do you have support at home?: Yes People in Home [RPT]: parent(s)  Medications Reviewed Today: Medications Reviewed Today     Reviewed by Starlene Charlynn JONETTA, CMA (Certified Medical Assistant) on 09/02/23 at 1057  Med List Status: <None>   Medication Order Taking? Sig Documenting Provider Last Dose Status Informant  acetaminophen  (TYLENOL ) 325 MG tablet [id number] Yes Take 2 tablets (650 mg total) by mouth every 6 (six) hours as needed. Elnor Jayson LABOR, DO  Active   albuterol  (VENTOLIN  HFA) 108 (90 Base) MCG/ACT inhaler [id number] Yes Inhale 2 puffs into the lungs every 6 (six) hours as needed for wheezing. Paseda, Folashade R, FNP  Active   aspirin  EC 81 MG tablet [id number]  Take 1 tablet by mouth twice daily for 30 days after surgery for blood clot prevention. Swallow whole.  Patient not taking: Reported on 05/19/2023   [place], Jesse J, PA-C  Active   clotrimazole -betamethasone  (LOTRISONE ) cream [id number] Yes Apply 1 Application topically 2 (two) times daily. Ula Prentice SAUNDERS, MD  Active   clotrimazole -betamethasone  (LOTRISONE ) cream [id number] Yes Apply 1 Application topically 2 (two) times daily. Ula Prentice SAUNDERS, MD  Active   doxycycline  (VIBRAMYCIN ) 100 MG capsule [id number]  Take 1 capsule (100 mg total) by mouth 2 (two) times daily.  Ula Prentice SAUNDERS, MD  Active   doxycycline  (VIBRAMYCIN ) 100 MG capsule [id number]  Take 1 capsule (100 mg total) by mouth 2 (two) times daily. Ula Prentice SAUNDERS, MD  Active   ibuprofen  (ADVIL ) 600 MG tablet [id number]  Take 1 tablet (600 mg total) by mouth every 6 (six) hours as needed.  Patient not taking: Reported on 05/19/2023   Elnor Jayson A, DO  Active   metroNIDAZOLE  (FLAGYL ) 500 MG tablet [id number]  Take 1 tablet (500 mg total) by mouth 2 (two) times daily. Ula Prentice SAUNDERS, MD  Active   metroNIDAZOLE  (FLAGYL ) 500 MG tablet [id number]  Take 1 tablet (500 mg total) by mouth 2 (two) times daily.  Patient not taking: Reported on 09/02/2023   Ula Prentice SAUNDERS, MD  Active   Multiple Vitamin (MULTIVITAMIN WITH MINERALS) TABS tablet [id number]  Take 1 tablet by mouth daily.  Patient not taking: Reported on 05/19/2023   McQuilla, Jai B, MD  Active   nicotine  (NICODERM CQ  - DOSED IN MG/24 HOURS) 21 mg/24hr patch [id number]  Place 1 patch (21 mg total) onto the skin daily.  Patient not taking: Reported on 05/19/2023   Mayers, Cari S, PA-C  Active   pantoprazole  (PROTONIX ) 20 MG tablet [id number] Yes Take 1 tablet (20 mg total) by mouth 2 (two) times daily before a meal. Paseda, Folashade R, FNP  Active   thiamine  (VITAMIN B-1) 100 MG tablet [id number]  Take 1 tablet (100 mg total) by mouth daily.  Patient not taking: Reported on 05/19/2023   Mayers, Cari S, PA-C  Active   traZODone  (DESYREL ) 50 MG tablet [id number]  Take 1 -  2 tabs PO QHS PRN  Patient not taking: Reported on 05/19/2023   Mayers, Cari S, PA-C  Active   triamcinolone  cream (KENALOG ) 0.1 % [id number] Yes Apply 1 Application topically 2 (two) times daily. Paseda, Folashade R, FNP  Active             Home Care and Equipment/Supplies: Were Home Health Services Ordered?: NA Any new equipment or medical supplies ordered?: NA  Functional Questionnaire: Do you need assistance with bathing/showering or dressing?: No Do you need assistance with meal  preparation?: No Do you need assistance with eating?: No Do you have difficulty maintaining continence: No Do you need assistance with getting out of bed/getting out of a chair/moving?: No Do you have difficulty managing or taking your medications?: No  Follow up appointments reviewed: Specialist Hospital Follow-up appointment confirmed?: NA Do you need transportation to your follow-up appointment?: No Do you understand care options if your condition(s) worsen?: Yes-patient verbalized understanding  SDOH Interventions Today    Flowsheet Row Most Recent Value  SDOH Interventions   Food Insecurity Interventions Intervention Not Indicated  Housing Interventions Intervention Not Indicated  Transportation Interventions Intervention Not Indicated    SIGNATURE Ibrahim Mcpheeters, RMA

## 2023-09-08 ENCOUNTER — Inpatient Hospital Stay: Payer: Self-pay | Admitting: Nurse Practitioner

## 2023-09-22 ENCOUNTER — Other Ambulatory Visit (HOSPITAL_COMMUNITY): Payer: Self-pay

## 2023-09-22 ENCOUNTER — Other Ambulatory Visit: Payer: Self-pay

## 2023-09-22 ENCOUNTER — Other Ambulatory Visit: Payer: Self-pay | Admitting: Nurse Practitioner

## 2023-09-22 DIAGNOSIS — K219 Gastro-esophageal reflux disease without esophagitis: Secondary | ICD-10-CM

## 2023-09-22 MED ORDER — PANTOPRAZOLE SODIUM 20 MG PO TBEC
20.0000 mg | DELAYED_RELEASE_TABLET | Freq: Two times a day (BID) | ORAL | 1 refills | Status: AC
  Filled 2023-09-22: qty 60, 30d supply, fill #0

## 2023-09-22 MED ORDER — CLOTRIMAZOLE-BETAMETHASONE 1-0.05 % EX CREA
1.0000 | TOPICAL_CREAM | Freq: Two times a day (BID) | CUTANEOUS | 0 refills | Status: DC
  Filled 2023-09-22: qty 30, 15d supply, fill #0

## 2023-09-22 MED ORDER — PANTOPRAZOLE SODIUM 20 MG PO TBEC
20.0000 mg | DELAYED_RELEASE_TABLET | Freq: Two times a day (BID) | ORAL | 1 refills | Status: DC
  Filled 2023-09-22: qty 60, 30d supply, fill #0

## 2023-09-22 MED ORDER — CLOTRIMAZOLE-BETAMETHASONE 1-0.05 % EX CREA
1.0000 | TOPICAL_CREAM | Freq: Two times a day (BID) | CUTANEOUS | 0 refills | Status: AC
  Filled 2023-09-22: qty 30, 15d supply, fill #0

## 2023-09-22 NOTE — Telephone Encounter (Signed)
 Copied from CRM #[id number]. Topic: Clinical - Medication Refill >> Sep 22, 2023  1:43 PM Nathanel BROCKS wrote: Medication: clotrimazole -betamethasone  (LOTRISONE ) cream pantoprazole  (PROTONIX ) 20 MG tablet Has the patient contacted their pharmacy? Yes  This is the patient's preferred pharmacy:   [id number] - [place], [place] - [address] AT [facility] OF [address] & [address] [place] [address] Phone: [phone] Fax: [phone]  Is this the correct pharmacy for this prescription? Yes If no, delete pharmacy and type the correct one.   Has the prescription been filled recently? Yes  Is the patient out of the medication? Yes  Has the patient been seen for an appointment in the last year OR does the patient have an upcoming appointment? Yes  Can we respond through MyChart? Yes  Agent: Please be advised that Rx refills may take up to 3 business days. We ask that you follow-up with your pharmacy.

## 2023-09-22 NOTE — Telephone Encounter (Signed)
 Patient called and requested [facility], updated and pended

## 2023-10-01 ENCOUNTER — Inpatient Hospital Stay: Payer: Self-pay | Admitting: Nurse Practitioner

## 2023-10-02 ENCOUNTER — Other Ambulatory Visit (HOSPITAL_COMMUNITY): Payer: Self-pay

## 2023-11-29 ENCOUNTER — Encounter (HOSPITAL_COMMUNITY): Payer: Self-pay

## 2023-11-29 ENCOUNTER — Other Ambulatory Visit: Payer: Self-pay

## 2023-11-29 ENCOUNTER — Emergency Department (HOSPITAL_COMMUNITY): Payer: MEDICAID

## 2023-11-29 ENCOUNTER — Emergency Department (HOSPITAL_COMMUNITY)
Admission: EM | Admit: 2023-11-29 | Discharge: 2023-11-29 | Disposition: A | Discharge status: Home / Self Care | Payer: MEDICAID | Attending: Emergency Medicine | Admitting: Emergency Medicine

## 2023-11-29 ENCOUNTER — Other Ambulatory Visit (HOSPITAL_COMMUNITY): Payer: Self-pay

## 2023-11-29 DIAGNOSIS — M545 Low back pain, unspecified: Secondary | ICD-10-CM | POA: Diagnosis not present

## 2023-11-29 DIAGNOSIS — Y92410 Unspecified street and highway as the place of occurrence of the external cause: Secondary | ICD-10-CM | POA: Insufficient documentation

## 2023-11-29 DIAGNOSIS — M25552 Pain in left hip: Secondary | ICD-10-CM | POA: Insufficient documentation

## 2023-11-29 MED ORDER — IBUPROFEN 800 MG PO TABS
800.0000 mg | ORAL_TABLET | Freq: Once | ORAL | Status: DC
  Filled 2023-11-29: qty 1

## 2023-11-29 MED ORDER — IBUPROFEN 600 MG PO TABS
600.0000 mg | ORAL_TABLET | Freq: Four times a day (QID) | ORAL | 0 refills | Status: AC | PRN
  Filled 2023-11-29: qty 30, 8d supply, fill #0

## 2023-11-29 MED ORDER — METHOCARBAMOL 500 MG PO TABS
500.0000 mg | ORAL_TABLET | Freq: Once | ORAL | Status: AC
  Administered 2023-11-29: 500 mg via ORAL
  Filled 2023-11-29: qty 1
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
## 2023-11-29 NOTE — Discharge Instructions (Addendum)
 X-ray and exam was reassuring today.  I have given you a prescription for ibuprofen  to take at home to help with pain.  Please use as needed for pain.  Monitor for any worsening symptoms and please return to ED for further evaluation if worsening symptoms occur including loss of function of any limbs or numbness of extremities.  I have also given you an orthopedic referral please call them to schedule an appointment if symptoms do not improve in the next few days.

## 2023-12-07 ENCOUNTER — Other Ambulatory Visit (HOSPITAL_COMMUNITY): Payer: Self-pay

## 2023-12-30 ENCOUNTER — Other Ambulatory Visit: Payer: Self-pay | Admitting: Surgery

## 2023-12-30 DIAGNOSIS — M545 Low back pain, unspecified: Secondary | ICD-10-CM

## 2024-02-05 ENCOUNTER — Inpatient Hospital Stay: Admission: RE | Admit: 2024-02-05 | Payer: MEDICAID | Source: Ambulatory Visit

## 2024-02-06 ENCOUNTER — Other Ambulatory Visit: Payer: MEDICAID
